# Patient Record
Sex: Female | Born: 2000 | Race: Black or African American | Hispanic: No | Marital: Single | Smoking: Former smoker
Health system: Southern US, Community
[De-identification: ages and names within clinical notes are randomized; demographics above are authoritative.]

## PROBLEM LIST (undated history)

## (undated) ENCOUNTER — Inpatient Hospital Stay (HOSPITAL_COMMUNITY): Payer: Self-pay

## (undated) DIAGNOSIS — J45909 Unspecified asthma, uncomplicated: Secondary | ICD-10-CM

## (undated) DIAGNOSIS — Z309 Encounter for contraceptive management, unspecified: Secondary | ICD-10-CM

## (undated) DIAGNOSIS — R300 Dysuria: Principal | ICD-10-CM

## (undated) DIAGNOSIS — O039 Complete or unspecified spontaneous abortion without complication: Secondary | ICD-10-CM

## (undated) DIAGNOSIS — N390 Urinary tract infection, site not specified: Secondary | ICD-10-CM

## (undated) DIAGNOSIS — T7840XA Allergy, unspecified, initial encounter: Secondary | ICD-10-CM

## (undated) HISTORY — DX: Dysuria: R30.0

## (undated) HISTORY — PX: NO PAST SURGERIES: SHX2092

## (undated) HISTORY — DX: Allergy, unspecified, initial encounter: T78.40XA

## (undated) HISTORY — DX: Complete or unspecified spontaneous abortion without complication: O03.9

## (undated) HISTORY — DX: Encounter for contraceptive management, unspecified: Z30.9

## (undated) HISTORY — DX: Unspecified asthma, uncomplicated: J45.909

## (undated) HISTORY — DX: Urinary tract infection, site not specified: N39.0

---
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
## 2018-01-18 ENCOUNTER — Other Ambulatory Visit: Payer: Medicaid Other

## 2018-01-20 ENCOUNTER — Other Ambulatory Visit: Payer: Medicaid Other

## 2018-02-01 ENCOUNTER — Ambulatory Visit (INDEPENDENT_AMBULATORY_CARE_PROVIDER_SITE_OTHER): Payer: Medicaid Other | Admitting: Women's Health

## 2018-02-01 ENCOUNTER — Encounter: Payer: Self-pay | Admitting: Women's Health

## 2018-02-01 ENCOUNTER — Ambulatory Visit (INDEPENDENT_AMBULATORY_CARE_PROVIDER_SITE_OTHER): Payer: Medicaid Other | Admitting: *Deleted

## 2018-02-01 ENCOUNTER — Other Ambulatory Visit: Payer: Self-pay

## 2018-02-01 VITALS — BP 110/56 | HR 84 | Wt 129.8 lb

## 2018-02-01 DIAGNOSIS — O26893 Other specified pregnancy related conditions, third trimester: Secondary | ICD-10-CM | POA: Diagnosis not present

## 2018-02-01 DIAGNOSIS — O98313 Other infections with a predominantly sexual mode of transmission complicating pregnancy, third trimester: Secondary | ICD-10-CM

## 2018-02-01 DIAGNOSIS — Z331 Pregnant state, incidental: Secondary | ICD-10-CM | POA: Diagnosis not present

## 2018-02-01 DIAGNOSIS — N898 Other specified noninflammatory disorders of vagina: Secondary | ICD-10-CM

## 2018-02-01 DIAGNOSIS — A599 Trichomoniasis, unspecified: Secondary | ICD-10-CM | POA: Diagnosis not present

## 2018-02-01 DIAGNOSIS — Z3A29 29 weeks gestation of pregnancy: Secondary | ICD-10-CM | POA: Diagnosis not present

## 2018-02-01 DIAGNOSIS — Z3490 Encounter for supervision of normal pregnancy, unspecified, unspecified trimester: Secondary | ICD-10-CM | POA: Insufficient documentation

## 2018-02-01 DIAGNOSIS — O0933 Supervision of pregnancy with insufficient antenatal care, third trimester: Secondary | ICD-10-CM

## 2018-02-01 DIAGNOSIS — Z1389 Encounter for screening for other disorder: Secondary | ICD-10-CM | POA: Diagnosis not present

## 2018-02-01 DIAGNOSIS — Z363 Encounter for antenatal screening for malformations: Secondary | ICD-10-CM

## 2018-02-01 DIAGNOSIS — Z3483 Encounter for supervision of other normal pregnancy, third trimester: Secondary | ICD-10-CM

## 2018-02-01 LAB — POCT URINALYSIS DIPSTICK
Blood, UA: NEGATIVE
GLUCOSE UA: NEGATIVE
Ketones, UA: NEGATIVE
Nitrite, UA: NEGATIVE

## 2018-02-01 LAB — POCT WET PREP (WET MOUNT): CLUE CELLS WET PREP WHIFF POC: POSITIVE

## 2018-02-01 MED ORDER — METRONIDAZOLE 500 MG PO TABS: 500.0000 mg | ORAL_TABLET | Freq: Two times a day (BID) | ORAL | 0 refills | Status: DC

## 2018-02-01 NOTE — Progress Notes (Signed)
INITIAL OBSTETRICAL VISIT Patient name: Autumn Ball MRN [id number]  Date of birth: April 30, 2001 Chief Complaint:   Initial Prenatal Visit  History of Present Illness:   Autumn Ball is a 17 y.o. 173P1011 African American female at 7573w6d by LMP c/w 15wk u/s, with an Estimated Date of Delivery: 04/13/18 being seen today for her initial obstetrical visit.   Her obstetrical history is significant for term uncomplicated SVB x 1, SAB x 1. Late prenatal care- [place] and has been trying to get appt there but unable to .   Today she reports vaginal d/c w/ odor x few weeks.  Patient's last menstrual period was 07/07/2017 (exact date). Last pap <21yo. Results were: n/a Review of Systems:   Pertinent items are noted in HPI Denies cramping/contractions, leakage of fluid, vaginal bleeding, abnormal vaginal discharge w/ itching/odor/irritation, headaches, visual changes, shortness of breath, chest pain, abdominal pain, severe nausea/vomiting, or problems with urination or bowel movements unless otherwise stated above.  Pertinent History Reviewed:  Reviewed past medical,surgical, social, obstetrical and family history.  Reviewed problem list, medications and allergies. OB History  Gravida Para Term Preterm AB Living  3 1 1  0 1 1  SAB TAB Ectopic Multiple Live Births  1 0 0 0 1    # Outcome Date GA Lbr Len/2nd Weight Sex Delivery Anes PTL Lv  3 Current           2 Term 09/29/16 4131w6d 11:30 / 00:59 6 lb 4.2 oz (2.84 kg) F Vag-Spont EPI  LIV  1 SAB 11/20/15             Physical Assessment:   Vitals:   02/01/18 1405  BP: (!) 110/56  Pulse: 84  Weight: 129 lb 12.8 oz (58.9 kg)  There is no height or weight on file to calculate BMI.       Physical Examination:  General appearance - well appearing, and in no distress  Mental status - alert, oriented to person, place, and time  Psych:  She has a normal mood and affect  Skin - warm and dry, normal color, no suspicious  lesions noted  Chest - effort normal, all lung fields clear to auscultation bilaterally  Heart - normal rate and regular rhythm  Abdomen - soft, nontender  Extremities:  No swelling or varicosities noted  Pelvic - VULVA: normal appearing vulva with no masses, tenderness or lesions  VAGINA: normal appearing vagina with normal color and lg amt thin yellow malodorous discharge, no lesions  CERVIX: normal appearing cervix without discharge or lesions, no CMT  Thin prep pap is not done  Fetal Heart Rate (bpm): 135 via doppler FH: 28cm  Results for orders placed or performed in visit on 02/01/18 (from the past 24 hour(s))  POCT urinalysis dipstick   Collection Time: 02/01/18  2:15 PM  Result Value Ref Range   Color, UA     Clarity, UA     Glucose, UA neg    Bilirubin, UA     Ketones, UA neg    Spec Grav, UA  1.010 - 1.025   Blood, UA neg    pH, UA  5.0 - 8.0   Protein, UA trace    Urobilinogen, UA  0.2 or 1.0 E.U./dL   Nitrite, UA neg    Leukocytes, UA Large (3+) (A) Negative   Appearance     Odor    POCT Wet Prep Mellody Drown([place]et [place])   Collection Time: 02/01/18  2:45  PM  Result Value Ref Range   Source Wet Prep POC vaginal    WBC, Wet Prep HPF POC many    Bacteria Wet Prep HPF POC Few Few   BACTERIA WET PREP MORPHOLOGY POC     Clue Cells Wet Prep HPF POC None None   Clue Cells Wet Prep Whiff POC Positive Whiff    Yeast Wet Prep HPF POC None    KOH Wet Prep POC     Trichomonas Wet Prep HPF POC Present (A) Absent    Assessment & Plan:  1) Low-Risk Pregnancy G3P1011 at [redacted]w[redacted]d with an Estimated Date of Delivery: 04/13/18   2) Initial OB visit  3) Late pnc> lives in [place] now, note sent to clinic to see if we can get her in there  4) Trichomonas> rx metronidazole 500mg  BID x 7d, pt states partner will get tx by his PCP. NO sex x at least 7d from both finishing meds. Plan poc for pt in 3-4wks.   Meds:  Meds ordered this encounter  Medications  . metroNIDAZOLE (FLAGYL) 500 MG  tablet    Sig: Take 1 tablet (500 mg total) by mouth 2 (two) times daily.    Dispense:  14 tablet    Refill:  0    Order Specific Question:   Supervising Provider    Answer:   Duane Lope H [[id number]]    Initial labs obtained Continue prenatal vitamins Reviewed n/v relief measures and warning s/s to report Reviewed recommended weight gain based on pre-gravid BMI Encouraged well-balanced diet Genetic Screening discussed Quad Screen: too late Cystic fibrosis screening discussed declined Ultrasound discussed; fetal survey: ordered CCNC completed>PCM not here  Follow-up: Return for asap for anatomy u/s and sugar test (no visit), then 2wks for LROB.   Orders Placed This Encounter  Procedures  . Urine Culture  . GC/Chlamydia Probe Amp  . US OB Comp + 14 Wk  . Pain Management Screening Profile (10S)  . CBC  . Rubella screen  . RPR  . Urinalysis, Routine w reflex microscopic  . Hepatitis B surface antigen  . HIV antibody  . POCT urinalysis dipstick  . POCT Wet Prep [employer])  . ABO/Rh  . Antibody screen    Cheral Marker CNM, [facility] 02/01/2018 2:49 PM

## 2018-02-01 NOTE — Patient Instructions (Addendum)
You will have your sugar test next visit.  Please do not eat or drink anything after midnight the night before you come, not even water.  You will be here for at least two hours.     Autumn Ball, I greatly value your feedback.  If you receive a survey following your visit with [place] today, we appreciate you taking the time to fill it out.  Thanks, Autumn Ball, CNM, WHNP-BC   Call the office [phone]) or go to [facility] if:  You begin to have strong, frequent contractions  Your water breaks.  Sometimes it is a big gush of fluid, sometimes it is just a trickle that keeps getting your panties wet or running down your legs  You have vaginal bleeding.  It is normal to have a small amount of spotting if your cervix was checked.   You don't feel your baby moving like normal.  If you don't, get you something to eat and drink and lay down and focus on feeling your baby move.  You should feel at least 10 movements in 2 hours.  If you don't, you should call the office or go to [facility].    Tdap Vaccine  It is recommended that you get the Tdap vaccine during the third trimester of EACH pregnancy to help protect your baby from getting pertussis (whooping cough)  27-36 weeks is the BEST time to do this so that you can pass the protection on to your baby. During pregnancy is better than after pregnancy, but if you are unable to get it during pregnancy it will be offered at the hospital.   You can get this vaccine at the health department or your family doctor  Everyone who will be around your baby should also be up-to-date on their vaccines. Adults (who are not pregnant) only need 1 dose of Tdap during adulthood.   Third Trimester of Pregnancy The third trimester is from week 29 through week 42, months 7 through 9. The third trimester is a time when the fetus is growing rapidly. At the end of the ninth month, the fetus is about 20 inches in length and weighs 6-10 pounds.  BODY  CHANGES Your body goes through many changes during pregnancy. The changes vary from woman to woman.   Your weight will continue to increase. You can expect to gain 25-35 pounds (11-16 kg) by the end of the pregnancy.  You may begin to get stretch marks on your hips, abdomen, and breasts.  You may urinate more often because the fetus is moving lower into your pelvis and pressing on your bladder.  You may develop or continue to have heartburn as a result of your pregnancy.  You may develop constipation because certain hormones are causing the muscles that push waste through your intestines to slow down.  You may develop hemorrhoids or swollen, bulging veins (varicose veins).  You may have pelvic pain because of the weight gain and pregnancy hormones relaxing your joints between the bones in your pelvis. Backaches may result from overexertion of the muscles supporting your posture.  You may have changes in your hair. These can include thickening of your hair, rapid growth, and changes in texture. Some women also have hair loss during or after pregnancy, or hair that feels dry or thin. Your hair will most likely return to normal after your baby is born.  Your breasts will continue to grow and be tender. A yellow discharge may leak from your breasts  called colostrum.  Your belly button may stick out.  You may feel short of breath because of your expanding uterus.  You may notice the fetus "dropping," or moving lower in your abdomen.  You may have a bloody mucus discharge. This usually occurs a few days to a week before labor begins.  Your cervix becomes thin and soft (effaced) near your due date. WHAT TO EXPECT AT YOUR PRENATAL EXAMS  You will have prenatal exams every 2 weeks until week 36. Then, you will have weekly prenatal exams. During a routine prenatal visit:  You will be weighed to make sure you and the fetus are growing normally.  Your blood pressure is taken.  Your abdomen  will be measured to track your baby's growth.  The fetal heartbeat will be listened to.  Any test results from the previous visit will be discussed.  You may have a cervical check near your due date to see if you have effaced. At around 36 weeks, your caregiver will check your cervix. At the same time, your caregiver will also perform a test on the secretions of the vaginal tissue. This test is to determine if a type of bacteria, Group B streptococcus, is present. Your caregiver will explain this further. Your caregiver may ask you:  What your birth plan is.  How you are feeling.  If you are feeling the baby move.  If you have had any abnormal symptoms, such as leaking fluid, bleeding, severe headaches, or abdominal cramping.  If you have any questions. Other tests or screenings that may be performed during your third trimester include:  Blood tests that check for low iron levels (anemia).  Fetal testing to check the health, activity level, and growth of the fetus. Testing is done if you have certain medical conditions or if there are problems during the pregnancy. FALSE LABOR You may feel small, irregular contractions that eventually go away. These are called Braxton Hicks contractions, or false labor. Contractions may last for hours, days, or even weeks before true labor sets in. If contractions come at regular intervals, intensify, or become painful, it is best to be seen by your caregiver.  SIGNS OF LABOR   Menstrual-like cramps.  Contractions that are 5 minutes apart or less.  Contractions that start on the top of the uterus and spread down to the lower abdomen and back.  A sense of increased pelvic pressure or back pain.  A watery or bloody mucus discharge that comes from the vagina. If you have any of these signs before the 37th week of pregnancy, call your caregiver right away. You need to go to the hospital to get checked immediately. HOME CARE INSTRUCTIONS   Avoid  all smoking, herbs, alcohol, and unprescribed drugs. These chemicals affect the formation and growth of the baby.  Follow your caregiver's instructions regarding medicine use. There are medicines that are either safe or unsafe to take during pregnancy.  Exercise only as directed by your caregiver. Experiencing uterine cramps is a good sign to stop exercising.  Continue to eat regular, healthy meals.  Wear a good support bra for breast tenderness.  Do not use hot tubs, steam rooms, or saunas.  Wear your seat belt at all times when driving.  Avoid raw meat, uncooked cheese, cat litter boxes, and soil used by cats. These carry germs that can cause birth defects in the baby.  Take your prenatal vitamins.  Try taking a stool softener (if your caregiver approves) if you develop  constipation. Eat more high-fiber foods, such as fresh vegetables or fruit and whole grains. Drink plenty of fluids to keep your urine clear or pale yellow.  Take warm sitz baths to soothe any pain or discomfort caused by hemorrhoids. Use hemorrhoid cream if your caregiver approves.  If you develop varicose veins, wear support hose. Elevate your feet for 15 minutes, 3-4 times a day. Limit salt in your diet.  Avoid heavy lifting, wear low heal shoes, and practice good posture.  Rest a lot with your legs elevated if you have leg cramps or low back pain.  Visit your dentist if you have not gone during your pregnancy. Use a soft toothbrush to brush your teeth and be gentle when you floss.  A sexual relationship may be continued unless your caregiver directs you otherwise.  Do not travel far distances unless it is absolutely necessary and only with the approval of your caregiver.  Take prenatal classes to understand, practice, and ask questions about the labor and delivery.  Make a trial run to the hospital.  Pack your hospital bag.  Prepare the baby's nursery.  Continue to go to all your prenatal visits as  directed by your caregiver. SEEK MEDICAL CARE IF:  You are unsure if you are in labor or if your water has broken.  You have dizziness.  You have mild pelvic cramps, pelvic pressure, or nagging pain in your abdominal area.  You have persistent nausea, vomiting, or diarrhea.  You have a bad smelling vaginal discharge.  You have pain with urination. SEEK IMMEDIATE MEDICAL CARE IF:   You have a fever.  You are leaking fluid from your vagina.  You have spotting or bleeding from your vagina.  You have severe abdominal cramping or pain.  You have rapid weight loss or gain.  You have shortness of breath with chest pain.  You notice sudden or extreme swelling of your face, hands, ankles, feet, or legs.  You have not felt your baby move in over an hour.  You have severe headaches that do not go away with medicine.  You have vision changes. Document Released: 10/26/2001 Document Revised: 11/06/2013 Document Reviewed: 01/02/2013 [facility] Patient Information 2015 [place], [place]. This information is not intended to replace advice given to you by your health care provider. Make sure you discuss any questions you have with your health care provider.   Trichomoniasis Trichomoniasis is an STI (sexually transmitted infection) that can affect both women and men. In women, the outer area of the female genitalia (vulva) and the vagina are affected. In men, the penis is mainly affected, but the prostate and other reproductive organs can also be involved. This condition can be treated with medicine. It often has no symptoms (is asymptomatic), especially in men. What are the causes? This condition is caused by an organism called Trichomonas vaginalis. Trichomoniasis most often spreads from person to person (is contagious) through sexual contact. What increases the risk? The following factors may make you more likely to develop this condition:  Having unprotected sexual intercourse.  Having  sexual intercourse with a partner who has trichomoniasis.  Having multiple sexual partners.  Having had previous trichomoniasis infections or other STIs.  What are the signs or symptoms? In women, symptoms of trichomoniasis include:  Abnormal vaginal discharge that is clear, white, gray, or yellow-green and foamy and has an unusual "fishy" odor.  Itching and irritation of the vagina and vulva.  Burning or pain during urination or sexual intercourse.  Genital redness  and swelling.  In men, symptoms of trichomoniasis include:  Penile discharge that may be foamy or contain pus.  Pain in the penis. This may happen only when urinating.  Itching or irritation inside the penis.  Burning after urination or ejaculation.  How is this diagnosed? In women, this condition may be found during a routine Pap test or physical exam. It may be found in men during a routine physical exam. Your health care provider may perform tests to help diagnose this infection, such as:  Urine tests (men and women).  The following in women: ? Testing the pH of the vagina. ? A vaginal swab test that checks for the Trichomonas vaginalis organism. ? Testing vaginal secretions.  Your health care provider may test you for other STIs, including HIV (human immunodeficiency virus). How is this treated? This condition is treated with medicine taken by mouth (orally), such as metronidazole or tinidazole to fight the infection. Your sexual partner(s) may also need to be tested and treated.  If you are a woman and you plan to become pregnant or think you may be pregnant, tell your health care provider right away. Some medicines that are used to treat the infection should not be taken during pregnancy.  Your health care provider may recommend over-the-counter medicines or creams to help relieve itching or irritation. You may be tested for infection again 3 months after treatment. Follow these instructions at  home:  Take and use over-the-counter and prescription medicines, including creams, only as told by your health care provider.  Do not have sexual intercourse until one week after you finish your medicine, or until your health care provider approves. Ask your health care provider when you may resume sexual intercourse.  (Women) Do not douche or wear tampons while you have the infection.  Discuss your infection with your sexual partner(s). Make sure that your partner gets tested and treated, if necessary.  Keep all follow-up visits as told by your health care provider. This is important. How is this prevented?  Use condoms every time you have sex. Using condoms correctly and consistently can help protect against STIs.  Avoid having multiple sexual partners.  Talk with your sexual partner about any symptoms that either of you may have, as well as any history of STIs.  Get tested for STIs and STDs (sexually transmitted diseases) before you have sex. Ask your partner to do the same.  Do not have sexual contact if you have symptoms of trichomoniasis or another STI. Contact a health care provider if:  You still have symptoms after you finish your medicine.  You develop pain in your abdomen.  You have pain when you urinate.  You have bleeding after sexual intercourse.  You develop a rash.  You feel nauseous or you vomit.  You plan to become pregnant or think you may be pregnant. Summary  Trichomoniasis is an STI (sexually transmitted infection) that can affect both women and men.  This condition often has no symptoms (is asymptomatic), especially in men.  You should not have sexual intercourse until one week after you finish your medicine, or until your health care provider approves. Ask your health care provider when you may resume sexual intercourse.  Discuss your infection with your sexual partner. Make sure that your partner gets tested and treated, if necessary. This  information is not intended to replace advice given to you by your health care provider. Make sure you discuss any questions you have with your health care provider.  Document Released: 04/27/2001 Document Revised: 09/24/2016 Document Reviewed: 09/24/2016 [employer]vier Interactive Patient Education  2017 [employer]

## 2018-02-02 LAB — CBC
HEMATOCRIT: 33.9 % — AB (ref 34.0–46.6)
Hemoglobin: 11.6 g/dL (ref 11.1–15.9)
MCH: 27.6 pg (ref 26.6–33.0)
MCHC: 34.2 g/dL (ref 31.5–35.7)
MCV: 81 fL (ref 79–97)
PLATELETS: 275 10*3/uL (ref 150–379)
RBC: 4.21 x10E6/uL (ref 3.77–5.28)
RDW: 15.9 % — AB (ref 12.3–15.4)
WBC: 10.4 10*3/uL (ref 3.4–10.8)

## 2018-02-02 LAB — RPR: RPR: NONREACTIVE

## 2018-02-02 LAB — URINALYSIS, ROUTINE W REFLEX MICROSCOPIC
Bilirubin, UA: NEGATIVE
Glucose, UA: NEGATIVE
Nitrite, UA: NEGATIVE
SPEC GRAV UA: 1.022 (ref 1.005–1.030)
Urobilinogen, Ur: 0.2 mg/dL (ref 0.2–1.0)
pH, UA: 6.5 (ref 5.0–7.5)

## 2018-02-02 LAB — ANTIBODY SCREEN: ANTIBODY SCREEN: NEGATIVE

## 2018-02-02 LAB — PMP SCREEN PROFILE (10S), URINE
AMPHETAMINE SCREEN URINE: NEGATIVE ng/mL
BARBITURATE SCREEN URINE: NEGATIVE ng/mL
BENZODIAZEPINE SCREEN, URINE: NEGATIVE ng/mL
CANNABINOIDS UR QL SCN: POSITIVE ng/mL — AB
CREATININE(CRT), U: 185.6 mg/dL (ref 20.0–300.0)
Cocaine (Metab) Scrn, Ur: NEGATIVE ng/mL
METHADONE SCREEN, URINE: NEGATIVE ng/mL
OXYCODONE+OXYMORPHONE UR QL SCN: NEGATIVE ng/mL
Opiate Scrn, Ur: NEGATIVE ng/mL
PHENCYCLIDINE QUANTITATIVE URINE: NEGATIVE ng/mL
PROPOXYPHENE SCREEN URINE: NEGATIVE ng/mL
Ph of Urine: 6.4 (ref 4.5–8.9)

## 2018-02-02 LAB — MICROSCOPIC EXAMINATION
CASTS: NONE SEEN /LPF
WBC, UA: >30 /hpf — AB (ref 0–5)

## 2018-02-02 LAB — ABO/RH: Rh Factor: POSITIVE

## 2018-02-02 LAB — RUBELLA SCREEN: Rubella Antibodies, IGG: 8.67 index (ref >0.99)

## 2018-02-02 LAB — HIV ANTIBODY (ROUTINE TESTING W REFLEX): HIV Screen 4th Generation wRfx: NONREACTIVE

## 2018-02-02 LAB — HEPATITIS B SURFACE ANTIGEN: Hepatitis B Surface Ag: NEGATIVE

## 2018-02-02 LAB — MED LIST OPTION NOT SELECTED

## 2018-02-03 ENCOUNTER — Encounter: Payer: Self-pay | Admitting: Women's Health

## 2018-02-03 LAB — URINE CULTURE

## 2018-02-03 LAB — GC/CHLAMYDIA PROBE AMP
Chlamydia trachomatis, NAA: NEGATIVE
Neisseria gonorrhoeae by PCR: NEGATIVE

## 2018-02-08 ENCOUNTER — Other Ambulatory Visit: Payer: Medicaid Other

## 2018-02-15 ENCOUNTER — Encounter: Payer: Medicaid Other | Admitting: Advanced Practice Midwife

## 2018-02-15 ENCOUNTER — Encounter: Payer: Self-pay | Admitting: Advanced Practice Midwife

## 2018-03-01 ENCOUNTER — Encounter: Payer: Self-pay | Admitting: *Deleted

## 2018-03-01 ENCOUNTER — Other Ambulatory Visit: Payer: Medicaid Other

## 2018-03-01 ENCOUNTER — Encounter: Payer: Medicaid Other | Admitting: Obstetrics and Gynecology

## 2018-04-01 ENCOUNTER — Other Ambulatory Visit: Payer: Self-pay

## 2018-04-01 ENCOUNTER — Inpatient Hospital Stay (HOSPITAL_COMMUNITY): Payer: Medicaid Other | Admitting: Anesthesiology

## 2018-04-01 ENCOUNTER — Inpatient Hospital Stay (HOSPITAL_COMMUNITY)
Admission: AD | Admit: 2018-04-01 | Discharge: 2018-04-03 | DRG: 806 | Disposition: A | Discharge status: Home / Self Care | Payer: Medicaid Other | Source: Ambulatory Visit | Attending: Obstetrics and Gynecology | Admitting: Obstetrics and Gynecology

## 2018-04-01 ENCOUNTER — Encounter (HOSPITAL_COMMUNITY): Payer: Self-pay | Admitting: *Deleted

## 2018-04-01 DIAGNOSIS — O99324 Drug use complicating childbirth: Secondary | ICD-10-CM | POA: Diagnosis present

## 2018-04-01 DIAGNOSIS — O9832 Other infections with a predominantly sexual mode of transmission complicating childbirth: Secondary | ICD-10-CM | POA: Diagnosis present

## 2018-04-01 DIAGNOSIS — O0933 Supervision of pregnancy with insufficient antenatal care, third trimester: Secondary | ICD-10-CM

## 2018-04-01 DIAGNOSIS — F1290 Cannabis use, unspecified, uncomplicated: Secondary | ICD-10-CM | POA: Diagnosis present

## 2018-04-01 DIAGNOSIS — Z3A38 38 weeks gestation of pregnancy: Secondary | ICD-10-CM

## 2018-04-01 DIAGNOSIS — O4290 Premature rupture of membranes, unspecified as to length of time between rupture and onset of labor, unspecified weeks of gestation: Secondary | ICD-10-CM | POA: Diagnosis present

## 2018-04-01 DIAGNOSIS — A5901 Trichomonal vulvovaginitis: Secondary | ICD-10-CM | POA: Diagnosis present

## 2018-04-01 DIAGNOSIS — Z87891 Personal history of nicotine dependence: Secondary | ICD-10-CM | POA: Diagnosis not present

## 2018-04-01 DIAGNOSIS — O4292 Full-term premature rupture of membranes, unspecified as to length of time between rupture and onset of labor: Principal | ICD-10-CM | POA: Diagnosis present

## 2018-04-01 LAB — CBC
HCT: 37.7 % (ref 36.0–46.0)
HEMOGLOBIN: 12.3 g/dL (ref 12.0–15.0)
MCH: 26.9 pg (ref 26.0–34.0)
MCHC: 32.6 g/dL (ref 30.0–36.0)
MCV: 82.5 fL (ref 78.0–100.0)
PLATELETS: 262 10*3/uL (ref 150–400)
RBC: 4.57 MIL/uL (ref 3.87–5.11)
RDW: 15.9 % — ABNORMAL HIGH (ref 11.5–15.5)
WBC: 8.7 10*3/uL (ref 4.0–10.5)

## 2018-04-01 LAB — POCT FERN TEST: POCT FERN TEST: POSITIVE

## 2018-04-01 LAB — OB RESULTS CONSOLE GBS: GBS: NEGATIVE

## 2018-04-01 LAB — RAPID URINE DRUG SCREEN, HOSP PERFORMED
AMPHETAMINES: NOT DETECTED
BARBITURATES: NOT DETECTED
Benzodiazepines: NOT DETECTED
COCAINE: NOT DETECTED
Opiates: NOT DETECTED
TETRAHYDROCANNABINOL: NOT DETECTED

## 2018-04-01 LAB — TYPE AND SCREEN
ABO/RH(D): O POS
ANTIBODY SCREEN: NEGATIVE

## 2018-04-01 LAB — GROUP B STREP BY PCR: Group B strep by PCR: NEGATIVE

## 2018-04-01 LAB — HEMOGLOBIN A1C
HEMOGLOBIN A1C: 4.9 % (ref 4.8–5.6)
MEAN PLASMA GLUCOSE: 93.93 mg/dL

## 2018-04-01 LAB — GLUCOSE, CAPILLARY: Glucose-Capillary: 68 mg/dL (ref 65–99)

## 2018-04-01 MED ORDER — ACETAMINOPHEN 325 MG PO TABS: 650.0000 mg | ORAL_TABLET | ORAL | Status: DC | PRN

## 2018-04-01 MED ORDER — PRENATAL MULTIVITAMIN CH
1.0000 | ORAL_TABLET | Freq: Every day | ORAL | Status: DC
  Administered 2018-04-02 – 2018-04-03 (×2): 1 via ORAL
  Filled 2018-04-01 (×2): qty 1

## 2018-04-01 MED ORDER — ONDANSETRON HCL 4 MG/2ML IJ SOLN: 4.0000 mg | INTRAMUSCULAR | Status: DC | PRN

## 2018-04-01 MED ORDER — ONDANSETRON HCL 4 MG/2ML IJ SOLN: 4.0000 mg | Freq: Four times a day (QID) | INTRAMUSCULAR | Status: DC | PRN

## 2018-04-01 MED ORDER — METRONIDAZOLE 500 MG PO TABS
2000.0000 mg | ORAL_TABLET | Freq: Once | ORAL | Status: AC
  Administered 2018-04-01: 2000 mg via ORAL
  Filled 2018-04-01 (×2): qty 4

## 2018-04-01 MED ORDER — SENNOSIDES-DOCUSATE SODIUM 8.6-50 MG PO TABS
2.0000 | ORAL_TABLET | ORAL | Status: DC
  Administered 2018-04-03: 2 via ORAL
  Filled 2018-04-01: qty 2

## 2018-04-01 MED ORDER — EPHEDRINE 5 MG/ML INJ
10.0000 mg | INTRAVENOUS | Status: DC | PRN
  Filled 2018-04-01: qty 2

## 2018-04-01 MED ORDER — LACTATED RINGERS IV SOLN
INTRAVENOUS | Status: DC
  Administered 2018-04-01 (×2): via INTRAVENOUS

## 2018-04-01 MED ORDER — SOD CITRATE-CITRIC ACID 500-334 MG/5ML PO SOLN: 30.0000 mL | ORAL | Status: DC | PRN

## 2018-04-01 MED ORDER — TERBUTALINE SULFATE 1 MG/ML IJ SOLN
0.2500 mg | Freq: Once | INTRAMUSCULAR | Status: DC | PRN
  Filled 2018-04-01: qty 1

## 2018-04-01 MED ORDER — OXYCODONE-ACETAMINOPHEN 5-325 MG PO TABS: 2.0000 | ORAL_TABLET | ORAL | Status: DC | PRN

## 2018-04-01 MED ORDER — COCONUT OIL OIL: 1.0000 "application " | TOPICAL_OIL | Status: DC | PRN

## 2018-04-01 MED ORDER — FENTANYL 2.5 MCG/ML BUPIVACAINE 1/10 % EPIDURAL INFUSION (WH - ANES)
14.0000 mL/h | INTRAMUSCULAR | Status: DC | PRN
  Administered 2018-04-01: 14 mL/h via EPIDURAL
  Filled 2018-04-01: qty 100

## 2018-04-01 MED ORDER — FLEET ENEMA 7-19 GM/118ML RE ENEM: 1.0000 | ENEMA | RECTAL | Status: DC | PRN

## 2018-04-01 MED ORDER — DIPHENHYDRAMINE HCL 25 MG PO CAPS: 25.0000 mg | ORAL_CAPSULE | Freq: Four times a day (QID) | ORAL | Status: DC | PRN

## 2018-04-01 MED ORDER — PHENYLEPHRINE 40 MCG/ML (10ML) SYRINGE FOR IV PUSH (FOR BLOOD PRESSURE SUPPORT)
80.0000 ug | PREFILLED_SYRINGE | INTRAVENOUS | Status: DC | PRN
  Filled 2018-04-01: qty 10
  Filled 2018-04-01: qty 5

## 2018-04-01 MED ORDER — BENZOCAINE-MENTHOL 20-0.5 % EX AERO
1.0000 "application " | INHALATION_SPRAY | CUTANEOUS | Status: DC | PRN
  Administered 2018-04-02: 1 via TOPICAL
  Filled 2018-04-01: qty 56

## 2018-04-01 MED ORDER — ONDANSETRON HCL 4 MG PO TABS: 4.0000 mg | ORAL_TABLET | ORAL | Status: DC | PRN

## 2018-04-01 MED ORDER — DIBUCAINE 1 % RE OINT: 1.0000 "application " | TOPICAL_OINTMENT | RECTAL | Status: DC | PRN

## 2018-04-01 MED ORDER — LIDOCAINE HCL (PF) 1 % IJ SOLN
INTRAMUSCULAR | Status: DC | PRN
  Administered 2018-04-01: 3 mL via EPIDURAL
  Administered 2018-04-01: 5 mL via EPIDURAL

## 2018-04-01 MED ORDER — OXYTOCIN 40 UNITS IN LACTATED RINGERS INFUSION - SIMPLE MED
1.0000 m[IU]/min | INTRAVENOUS | Status: DC
  Administered 2018-04-01: 2 m[IU]/min via INTRAVENOUS

## 2018-04-01 MED ORDER — OXYTOCIN BOLUS FROM INFUSION
500.0000 mL | Freq: Once | INTRAVENOUS | Status: AC
  Administered 2018-04-01: 500 mL via INTRAVENOUS

## 2018-04-01 MED ORDER — WITCH HAZEL-GLYCERIN EX PADS: 1.0000 "application " | MEDICATED_PAD | CUTANEOUS | Status: DC | PRN

## 2018-04-01 MED ORDER — LIDOCAINE HCL (PF) 1 % IJ SOLN
30.0000 mL | INTRAMUSCULAR | Status: DC | PRN
  Filled 2018-04-01: qty 30

## 2018-04-01 MED ORDER — DIPHENHYDRAMINE HCL 50 MG/ML IJ SOLN: 12.5000 mg | INTRAMUSCULAR | Status: DC | PRN

## 2018-04-01 MED ORDER — LACTATED RINGERS IV SOLN: 500.0000 mL | INTRAVENOUS | Status: DC | PRN

## 2018-04-01 MED ORDER — OXYCODONE-ACETAMINOPHEN 5-325 MG PO TABS: 1.0000 | ORAL_TABLET | ORAL | Status: DC | PRN

## 2018-04-01 MED ORDER — TETANUS-DIPHTH-ACELL PERTUSSIS 5-2.5-18.5 LF-MCG/0.5 IM SUSP
0.5000 mL | Freq: Once | INTRAMUSCULAR | Status: AC
  Administered 2018-04-03: 0.5 mL via INTRAMUSCULAR
  Filled 2018-04-01: qty 0.5

## 2018-04-01 MED ORDER — PHENYLEPHRINE 40 MCG/ML (10ML) SYRINGE FOR IV PUSH (FOR BLOOD PRESSURE SUPPORT)
80.0000 ug | PREFILLED_SYRINGE | INTRAVENOUS | Status: DC | PRN
  Filled 2018-04-01: qty 5

## 2018-04-01 MED ORDER — ZOLPIDEM TARTRATE 5 MG PO TABS: 5.0000 mg | ORAL_TABLET | Freq: Every evening | ORAL | Status: DC | PRN

## 2018-04-01 MED ORDER — IBUPROFEN 600 MG PO TABS
600.0000 mg | ORAL_TABLET | Freq: Four times a day (QID) | ORAL | Status: DC
  Administered 2018-04-01 – 2018-04-03 (×7): 600 mg via ORAL
  Filled 2018-04-01 (×7): qty 1

## 2018-04-01 MED ORDER — OXYTOCIN 40 UNITS IN LACTATED RINGERS INFUSION - SIMPLE MED
2.5000 [IU]/h | INTRAVENOUS | Status: DC
  Administered 2018-04-01: 2.5 [IU]/h via INTRAVENOUS
  Filled 2018-04-01: qty 1000

## 2018-04-01 MED ORDER — LACTATED RINGERS IV SOLN
500.0000 mL | Freq: Once | INTRAVENOUS | Status: AC
  Administered 2018-04-01: 500 mL via INTRAVENOUS

## 2018-04-01 MED ORDER — SIMETHICONE 80 MG PO CHEW: 80.0000 mg | CHEWABLE_TABLET | ORAL | Status: DC | PRN

## 2018-04-01 NOTE — Anesthesia Pain Management Evaluation Note (Signed)
  CRNA Pain Management Visit Note  Patient: Autumn Ball, 17 y.o., female  "Hello I am a member of the anesthesia team at [facility]. We have an anesthesia team available at all times to provide care throughout the hospital, including epidural management and anesthesia for C-section. I don't know your plan for the delivery whether it a natural birth, water birth, IV sedation, nitrous supplementation, doula or epidural, but we want to meet your pain goals."   1.Was your pain managed to your expectations on prior hospitalizations?   Yes   2.What is your expectation for pain management during this hospitalization?     Epidural  3.How can we help you reach that goal? unsure  Record the patient's initial score and the patient's pain goal.   Pain: 0  Pain Goal: 8 [facility] wants you to be able to say your pain was always managed very well.  Cephus Shelling 04/01/2018

## 2018-04-01 NOTE — H&P (Signed)
OBSTETRIC ADMISSION HISTORY AND PHYSICAL  Autumn Ball is a 17 y.o. female and  G3P1011 with IUP at [redacted]w[redacted]d by 15wk ultrasound presenting for SROM at 12:02PM with +fern test. She reports +FMs, No LOF, no VB, no blurry vision, headaches or peripheral edema, and RUQ pain.  She plans on bottle feeding. She request depo provera for birth control.    She received her prenatal care at [facility]   Patient was late to prenatal care at 29 weeks.  She had a single prenatal care visit at 29wk6d. At the visit lab results showed +THC and +trichomonas treated with metronidazole.  She states her partner tested negative and took some of the antibiotic.  She feels her contractions and fetal movement.  Dating: By [place] --->  Estimated Date of Delivery: 04/13/18  Limited Sono:    , CWD, normal anatomy, posterior low lying placenta,  Cephalic, presentation, 148g   Prenatal History/Complications:  Past Medical History: Past Medical History:  Diagnosis Date  . Allergy   . Asthma   . Burning with urination 01/19/2016  . Contraceptive management 06/11/2015  . Miscarriage   . UTI (lower urinary tract infection) 01/19/2016    Past Surgical History: Past Surgical History:  Procedure Laterality Date  . NO PAST SURGERIES      Obstetrical History: OB History    Gravida  3   Para  1   Term  1   Preterm  0   AB  1   Living  1     SAB  1   TAB  0   Ectopic  0   Multiple  0   Live Births  1           Social History: Social History   Socioeconomic History  . Marital status: Single    Spouse name: Not on file  . Number of children: Not on file  . Years of education: Not on file  . Highest education level: Not on file  Occupational History  . Not on file  Social Needs  . Financial resource strain: Not on file  . Food insecurity:    Worry: Not on file    Inability: Not on file  . Transportation needs:    Medical: Not on file    Non-medical: Not on file  Tobacco Use  .  Smoking status: Former Smoker    Types: Cigarettes    Last attempt to quit: 11/13/2015    Years since quitting: 2.3  . Smokeless tobacco: Former Engineer, water and Sexual Activity  . Alcohol use: No    Alcohol/week: 0.0 oz  . Drug use: Yes    Types: Marijuana    Comment: last used in feb 2019  . Sexual activity: Not Currently    Birth control/protection: None  Lifestyle  . Physical activity:    Days per week: Not on file    Minutes per session: Not on file  . Stress: Not on file  Relationships  . Social connections:    Talks on phone: Not on file    Gets together: Not on file    Attends religious service: Not on file    Active member of club or organization: Not on file    Attends meetings of clubs or organizations: Not on file    Relationship status: Not on file  Other Topics Concern  . Not on file  Social History Narrative   Lives with mother and younger sister    Family History:  Family History  Problem Relation Age of Onset  . Depression Mother   . Hearing loss Mother   . Hearing loss Sister   . Vision loss Sister   . Hearing loss Maternal Grandmother   . Heart disease Maternal Grandmother   . Hyperlipidemia Maternal Grandmother   . Hypertension Maternal Grandmother   . Vision loss Maternal Grandmother   . Arthritis Maternal Grandfather     Allergies: No Known Allergies  Medications Prior to Admission  Medication Sig Dispense Refill Last Dose  . metroNIDAZOLE (FLAGYL) 500 MG tablet Take 1 tablet (500 mg total) by mouth 2 (two) times daily. 14 tablet 0   . prenatal vitamin w/FE, FA (PRENATAL 1 + 1) 27-1 MG TABS tablet Take 1 tablet daily at 12 noon by mouth. 30 each 12 Taking     Review of Systems   All systems reviewed and negative except as stated in HPI  Blood pressure 128/78, pulse 90, temperature 98.5 F (36.9 C), temperature source Oral, resp. rate 18, height  (1.6 m), weight 61.8 kg (136 lb 3 oz), last menstrual period 07/07/2017, SpO2 100  %, not currently breastfeeding. General appearance: alert and cooperative Lungs: clear to auscultation bilaterally Heart: regular rate and rhythm Abdomen: soft, non-tender; bowel sounds normal Extremities: Homans sign is negative, no sign of DVT DTR's present Presentation: deferred Fetal monitoringBaseline: 130 bpm, Variability: Good {> 6 bpm), Accelerations: Reactive and Decelerations: Absent Uterine activity: Irregular Dilation: 4 Effacement (%): 70 Station: -1 Exam by:: Doreatha Massed, RNC   Prenatal labs: ABO, Rh: O/Positive/-- (03/20 1458) Antibody: Negative (03/20 1458) Rubella: 8.67 (03/20 1458) RPR: Non Reactive (03/20 1458)  HBsAg: Negative (03/20 1458)  HIV: Non Reactive (03/20 1458)  GBS:    1 hr Glucola: Limited prenatal care did not receive Genetic screening: Limited prenatal care did not receive Anatomy US: Nml (Limited ultrasound at 15wk6d)  Prenatal Transfer Tool  Maternal Diabetes: Not tested Genetic Screening: Did not receive Maternal Ultrasounds/Referrals: Normal (limited ultrasound due to fetal age) Fetal Ultrasounds or other Referrals:  None Maternal Substance Abuse:  Yes:  Type: Marijuana Significant Maternal Medications:  Meds include: Other: Metronidazole for Trichomonas  Significant Maternal Lab Results: Lab values include: Other:  +Trichomonas at 29 wks +THC + Results for orders placed or performed during the hospital encounter of 04/01/18 (from the past 24 hour(s))  POCT fern test   Collection Time: 04/01/18  1:25 PM  Result Value Ref Range   POCT Fern Test Positive = ruptured amniotic membanes     Patient Active Problem List   Diagnosis Date Noted  . Supervision of normal pregnancy 02/01/2018  . Late prenatal care affecting pregnancy in third trimester 02/01/2018  . Trichomonas infection 02/01/2018  . Marijuana use 03/08/2016    Assessment/Plan:  [name] is a 17 y.o. G3P1011 at [redacted]w[redacted]d here for SROM at 12:02PM.  Patient was late to  prenatal care at 29 weeks with single prenatal office visit and +THC, +trichomonas that may not have been adequately treated.    #Labor: Progressing normally.  Start pitocin, 2x2. #Pain: Analgesics as needed #FWB: Cat I #ID:  GBS unknown: Penicillin             Trichomonas: Metronidazole  #MOF: Bottle Feeding #MOC: Depo Provera #Circ:  No  Alfonso Ellis, Medical Student  04/01/2018, 1:43 PM

## 2018-04-01 NOTE — Anesthesia Preprocedure Evaluation (Signed)
Anesthesia Evaluation  Patient identified by MRN, date of birth, ID band Patient awake    Reviewed: Allergy & Precautions, H&P , NPO status , Patient's Chart, lab work & pertinent test results  Airway Mallampati: II   Neck ROM: full    Dental   Pulmonary asthma , former smoker,    breath sounds clear to auscultation       Cardiovascular negative cardio ROS   Rhythm:regular Rate:Normal     Neuro/Psych    GI/Hepatic   Endo/Other    Renal/GU      Musculoskeletal   Abdominal   Peds  Hematology   Anesthesia Other Findings   Reproductive/Obstetrics                             Anesthesia Physical Anesthesia Plan  ASA: II  Anesthesia Plan: Epidural   Post-op Pain Management:    Induction: Intravenous  PONV Risk Score and Plan: 2 and Treatment may vary due to age or medical condition  Airway Management Planned: Natural Airway  Additional Equipment:   Intra-op Plan:   Post-operative Plan:   Informed Consent: I have reviewed the patients History and Physical, chart, labs and discussed the procedure including the risks, benefits and alternatives for the proposed anesthesia with the patient or authorized representative who has indicated his/her understanding and acceptance.     Plan Discussed with: Anesthesiologist  Anesthesia Plan Comments:         Anesthesia Quick Evaluation

## 2018-04-01 NOTE — Progress Notes (Addendum)
Labor Progress Note SEVERINA SYKORA is a 17 y.o. G3P1011 at [redacted]w[redacted]d presented with SROM at 12:02PM.   S: Patient pain level 6-7.  Able to feel regular contractions and fetal movement.  O:  BP 117/63   Pulse 77   Temp 98.4 F (36.9 C) (Oral)   Resp 20   Ht  (1.6 m)   Wt 61.8 kg (136 lb 3 oz)   LMP 07/07/2017 (Exact Date)   SpO2 100%   BMI 24.12 kg/m  EFM: 130bpm, moderate variability, reactive accelerations, absent decelerations  CVE: Dilation: 4.5 Effacement (%): 80 Station: -2, -1 Presentation: Vertex Exam by:: Camelia Eng, RN   A&P: 17 y.o. [id number] [redacted]w[redacted]d here for SROM at 12:02PM.  Patient was late to prenatal care at 29 weeks with single prenatal office visit and +THC, +trichomonas that may not have been adequately treated.    #Labor: Progressing normally. Continue pitocin. #Pain: Analgesics as needed.   #FWB: Cat I #GBS negative  #Trichomonas: Metronidazole    Alfonso Ellis, Medical Student 6:42 PM

## 2018-04-01 NOTE — Anesthesia Procedure Notes (Signed)
Epidural Patient location during procedure: OB Start time: 04/01/2018 7:39 PM End time: 04/01/2018 7:49 PM  Staffing Anesthesiologist: Achille Rich, MD Performed: anesthesiologist   Preanesthetic Checklist Completed: patient identified, site marked, pre-op evaluation, timeout performed, IV checked, risks and benefits discussed and monitors and equipment checked  Epidural Patient position: sitting Prep: DuraPrep Patient monitoring: heart rate, cardiac monitor, continuous pulse ox and blood pressure Approach: midline Location: L2-L3 Injection technique: LOR saline  Needle:  Needle type: Tuohy  Needle gauge: 17 G Needle length: 9 cm Needle insertion depth: 4 cm Catheter type: closed end flexible Catheter size: 19 Gauge Catheter at skin depth: 10 cm Test dose: negative and Other  Assessment Events: blood not aspirated, injection not painful, no injection resistance and negative IV test  Additional Notes Informed consent obtained prior to proceeding including risk of failure, 1% risk of PDPH, risk of minor discomfort and bruising.  Discussed rare but serious complications including epidural abscess, permanent nerve injury, epidural hematoma.  Discussed alternatives to epidural analgesia and patient desires to proceed.  Timeout performed pre-procedure verifying patient name, procedure, and platelet count.  Patient tolerated procedure well. Reason for block:procedure for pain

## 2018-04-01 NOTE — Progress Notes (Signed)
LABOR PROGRESS NOTE  Autumn Ball is a 17 y.o. G3P1011 at [redacted]w[redacted]d  admitted for SOL  Subjective: Doing well, comfortable with epidural. No complaints  Objective: BP 129/60   Pulse 92   Temp 98.1 F (36.7 C) (Oral)   Resp 18   Ht  (1.6 m)   Wt 61.8 kg (136 lb 3 oz)   LMP 07/07/2017 (Exact Date)   SpO2 100%   BMI 24.12 kg/m  or  Vitals:   04/01/18 2015 04/01/18 2016 04/01/18 2020 04/01/18 2030  BP: 126/67 126/67 (!) 103/55 129/60  Pulse: 82 82 (!) 103 92  Resp: Temp:      TempSrc:      SpO2: 100% 100%    Weight:      Height:         Dilation: 6 Effacement (%): 80 Cervical Position: Middle Station: 0, -1 Presentation: Vertex Exam by:: Gwendolyn Grant, RN  FHT: 130 bpm, moderate variability, no accelerations, no decelerations Uterine activity: q74min  Labs: Lab Results  Component Value Date   WBC 8.7 04/01/2018   HGB 12.3 04/01/2018   HCT 37.7 04/01/2018   MCV 82.5 04/01/2018   PLT 262 04/01/2018    Patient Active Problem List   Diagnosis Date Noted  . Leakage of amniotic fluid 04/01/2018  . Supervision of normal pregnancy 02/01/2018  . Late prenatal care affecting pregnancy in third trimester 02/01/2018  . Trichomonas infection 02/01/2018  . Marijuana use 03/08/2016    Assessment / Plan: 17 y.o. G3P1011 at [redacted]w[redacted]d here for SOL  Labor: pitocin Fetal Wellbeing:  Category I Pain Control:  epidural Anticipated MOD:  SVD  Leland Her, DO PGY-2 5/18/20198:54 PM

## 2018-04-01 NOTE — MAU Note (Signed)
Pt presents with c/o LOF @ 1202 this afternoon.  Reports fluid is clear.  Denies VB, states having ctxs.  Reports +FM.

## 2018-04-02 LAB — RPR: RPR Ser Ql: NONREACTIVE

## 2018-04-02 LAB — HIV ANTIBODY (ROUTINE TESTING W REFLEX): HIV Screen 4th Generation wRfx: NONREACTIVE

## 2018-04-02 NOTE — Clinical Social Work Maternal (Signed)
CLINICAL SOCIAL WORK MATERNAL/CHILD NOTE  Patient Details  Name: Autumn Ball MRN: [id number] Date of Birth: 04/01/2020  Date:  04/02/2018  Clinical Social Worker Initiating Note:  Madilyn Fireman, MSW, LCSW-A Date/Time: Initiated:  04/02/18/1057     Child's Name:  Autumn Ball   Biological Parents:  Mother, Father   Need for Interpreter:  None   Reason for Referral:  Late or No Prenatal Care    Address:  [place] [address]    Phone number:  [phone] (home)     Additional phone number:   Household Members/Support Persons (HM/SP):       HM/SP Name Relationship DOB or Age  HM/SP -1        HM/SP -2        HM/SP -3        HM/SP -4        HM/SP -5        HM/SP -6        HM/SP -7        HM/SP -8          Natural Supports (not living in the home):  Extended Family, Friends, Immediate Family, Spouse/significant other   Professional Supports: None   Employment: Unemployed   Type of Work:     Education:  9 to 11 years(Patient is graduating from [employer] in June 2019)   Homebound arranged: Yes(Patient is finishing her last semester of high school online)  Museum/gallery curator Resources:  Medicaid   Other Resources:  Location manager educated patient on how to obtain [facility])   Cultural/Religious Considerations Which May Impact Care:  None  Strengths:  Ability to meet basic needs , Engineer, materials, Home prepared for child    Psychotropic Medications:         Pediatrician:    Solicitor area  Pediatrician List:   [facility] for [place]      Pediatrician Fax Number:    Risk Factors/Current Problems:      Cognitive State:  Alert , Able to Concentrate    Mood/Affect:  Comfortable , Calm    CSW Assessment: CSW met with patient and infant, Makonnen at bedside to discuss consult reasoning for late prenatal care. Per  patient, she moved to [place] at ~29 weeks and had difficulty getting an OB appointment. 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 Patient lives alone with her daughter at an apartment in [place]. Patient's daughter is 33 months old, named Autumn Ball who is currently with her father while patient is at [facility]. Patient reports that the infant will go to [facility]. Patient has a brand new car seat for infant and knows how to use and install it. Patient has a bassinet and pack and play for infant for safe sleeping, SIDS reduction methods discussed. Patient is not employed and does not receive [place] or Food Stamps. CSW and patient discussed getting [place] for daughter and son for nutrition assistance. Patient states that the father of this child is currently incarcerated in prison and will be released in August, patient did not provide CSW with his name. Patient states she has a great support system. CSW educated patient on hospital drug screening policy for urine and cord due to [facility] Patient UDS negative upon admission. Patient  stated understanding and agreement and that her last use of THC was in February 2019. Patient not concerned about results and had no questions. CSW will continue to monitor for urine and cord results for infant, will make report if necessary.    CSW Plan/Description:  Sudden Infant Death Syndrome (SIDS) Education, Perinatal Mood and Anxiety Disorder (PMADs) Education, CSW Will Continue to Monitor Umbilical Cord Tissue Drug Screen Results and Make Report if [facility] [place], No Further Intervention Required/No Barriers to Discharge    Archie Endo, [place] 04/02/2018, 10:59 AM

## 2018-04-02 NOTE — Anesthesia Postprocedure Evaluation (Signed)
Anesthesia Post Note  Patient: Azalia L Botkin  Procedure(s) Performed: AN AD HOC LABOR EPIDURAL     Patient location during evaluation: Mother Baby Anesthesia Type: Epidural Level of consciousness: awake and alert and oriented Pain management: satisfactory to patient Vital Signs Assessment: post-procedure vital signs reviewed and stable Respiratory status: respiratory function stable Cardiovascular status: stable Postop Assessment: no headache, no backache, epidural receding, patient able to bend at knees, no signs of nausea or vomiting and adequate PO intake Anesthetic complications: no    Last Vitals:  Vitals:   04/02/18 0031 04/02/18 0500  BP: 109/65 (!) 112/50  Pulse: 84 60  Resp: 18 16  Temp: 36.8 C 36.9 C  SpO2: 96% 100%    Last Pain:  Vitals:   04/02/18 0601  TempSrc:   PainSc: 0-No pain   Pain Goal:                 Crislyn Willbanks

## 2018-04-02 NOTE — Progress Notes (Addendum)
Post Partum Day 1 Subjective: no complaints  Objective: Blood pressure (!) 112/50, pulse 60, temperature 98.4 F (36.9 C), temperature source Axillary, resp. rate 16, height  (1.6 m), weight 136 lb 3 oz (61.8 kg), last menstrual period 07/07/2017, SpO2 100 %, unknown if currently breastfeeding.  Physical Exam:  General: alert and no distress Lochia: appropriate Uterine Fundus: firm DVT Evaluation: No evidence of DVT seen on physical exam.  Recent Labs    04/01/18 1349  HGB 12.3  HCT 37.7    Assessment/Plan: Plan for discharge tomorrow   LOS: 1 day   Garnette Gunner 04/02/2018, 7:50 AM  I assessed this pt and agree with the above assessment

## 2018-04-03 ENCOUNTER — Telehealth: Payer: Self-pay | Admitting: *Deleted

## 2018-04-03 MED ORDER — IBUPROFEN 600 MG PO TABS: 600.0000 mg | ORAL_TABLET | Freq: Four times a day (QID) | ORAL | 0 refills | Status: DC

## 2018-04-03 NOTE — Telephone Encounter (Signed)
Tried to call pt to set up pp appt but unable to leave message.  04-03-18  AS

## 2018-04-03 NOTE — Discharge Summary (Signed)
OB Discharge Summary     Patient Name: Autumn Ball DOB: 2001/07/04 MRN: [id number]  Date of admission: 04/01/2018 Delivering MD: Sharyon Cable   Date of discharge: 04/03/2018  Admitting diagnosis: 38 WKS, WATER LEAKING Intrauterine pregnancy: [redacted]w[redacted]d     Secondary diagnosis:  Active Problems:   Leakage of amniotic fluid   SVD (spontaneous vaginal delivery)  Additional problems: teen pregnancy, marijuana use, late prenatal care, trichomonas infection     Discharge diagnosis: Term Pregnancy Delivered                                                                                                Post partum procedures:none  Augmentation: AROM and Pitocin  Complications: None  Hospital course:  Onset of Labor With Vaginal Delivery     17 y.o. yo [id number] at [redacted]w[redacted]d was admitted in Latent Labor and SROM on 04/01/2018. Patient had an uncomplicated labor course as follows:  Membrane Rupture Time/Date: 12:02 PM ,04/01/2018   Intrapartum Procedures: Episiotomy: None [1]                                         Lacerations:  1st degree [2];Perineal [11];Labial [10]  Patient had a delivery of a Viable infant. 04/01/2018  Information for the patient's newborn:  Autumn, Ball [[id number]]  Delivery Method: Vaginal, Spontaneous(Filed from Delivery Summary)    Pateint had an uncomplicated postpartum course.  She is ambulating, tolerating a regular diet, passing flatus, and urinating well. Patient is discharged home in stable condition on 04/03/18.   Physical exam  Vitals:   04/02/18 0500 04/02/18 1428 04/02/18 1733 04/03/18 0500  BP: (!) 112/50 (!) 120/58 117/64 107/61  Pulse: 60 74 81 61  Resp: Temp: 98.4 F (36.9 C) 98.8 F (37.1 C) 98.1 F (36.7 C) 98.3 F (36.8 C)  TempSrc: Axillary Oral  Oral  SpO2: 100%   97%  Weight:      Height:       General: alert, cooperative and no distress Lochia: appropriate Uterine Fundus: firm Incision: Healing well with  no significant drainage, No significant erythema DVT Evaluation: No evidence of DVT seen on physical exam. Negative Homan's sign. No cords or calf tenderness. No significant calf/ankle edema. Labs: Lab Results  Component Value Date   WBC 8.7 04/01/2018   HGB 12.3 04/01/2018   HCT 37.7 04/01/2018   MCV 82.5 04/01/2018   PLT 262 04/01/2018   No flowsheet data found.  Discharge instruction: per After Visit Summary and "Baby and Me Booklet".  After visit meds:  Allergies as of 04/03/2018   No Known Allergies     Medication List    STOP taking these medications   metroNIDAZOLE 500 MG tablet Commonly known as:  FLAGYL     TAKE these medications   ibuprofen 600 MG tablet Commonly known as:  ADVIL,MOTRIN Take 1 tablet (600 mg total) by mouth every 6 (six) hours.   prenatal vitamin w/FE, FA 27-1 MG  Tabs tablet Take 1 tablet daily at 12 noon by mouth.       Diet: routine diet  Activity: Advance as tolerated. Pelvic rest for 6 weeks.   Outpatient follow up:6 weeks Follow up Appt:No future appointments. Follow up Visit:No follow-ups on file.  Postpartum contraception: Depo Provera  Newborn Data: Live born female  Birth Weight: 6 lb 5.1 oz (2865 g) APGAR: 9, 9  Newborn Delivery   Birth date/time:  04/01/2018 21:35:00 Delivery type:  Vaginal, Spontaneous     Baby Feeding: Bottle Disposition:home with mother   04/03/2018 Donette Larry, CNM

## 2018-05-04 ENCOUNTER — Encounter: Payer: Self-pay | Admitting: *Deleted

## 2018-05-04 ENCOUNTER — Ambulatory Visit: Payer: Medicaid Other | Admitting: Advanced Practice Midwife

## 2018-05-24 ENCOUNTER — Ambulatory Visit: Payer: Medicaid Other | Admitting: Women's Health

## 2018-07-11 ENCOUNTER — Ambulatory Visit: Payer: Medicaid Other | Admitting: Advanced Practice Midwife

## 2018-09-14 ENCOUNTER — Ambulatory Visit: Payer: Medicaid Other | Admitting: Women's Health

## 2018-11-20 DIAGNOSIS — H52223 Regular astigmatism, bilateral: Secondary | ICD-10-CM | POA: Diagnosis not present

## 2018-11-20 DIAGNOSIS — H5203 Hypermetropia, bilateral: Secondary | ICD-10-CM | POA: Diagnosis not present

## 2018-11-20 DIAGNOSIS — G44209 Tension-type headache, unspecified, not intractable: Secondary | ICD-10-CM | POA: Diagnosis not present

## 2018-11-22 DIAGNOSIS — H5213 Myopia, bilateral: Secondary | ICD-10-CM | POA: Diagnosis not present

## 2018-12-14 DIAGNOSIS — H52223 Regular astigmatism, bilateral: Secondary | ICD-10-CM | POA: Diagnosis not present

## 2018-12-14 DIAGNOSIS — H5203 Hypermetropia, bilateral: Secondary | ICD-10-CM | POA: Diagnosis not present

## 2018-12-21 NOTE — Progress Notes (Signed)
ERROR

## 2019-08-15 DIAGNOSIS — R509 Fever, unspecified: Secondary | ICD-10-CM | POA: Diagnosis not present

## 2019-08-15 DIAGNOSIS — Z20828 Contact with and (suspected) exposure to other viral communicable diseases: Secondary | ICD-10-CM | POA: Diagnosis not present

## 2019-08-15 DIAGNOSIS — R0981 Nasal congestion: Secondary | ICD-10-CM | POA: Diagnosis not present

## 2019-08-15 DIAGNOSIS — U071 COVID-19: Secondary | ICD-10-CM | POA: Diagnosis not present

## 2019-08-15 DIAGNOSIS — J3489 Other specified disorders of nose and nasal sinuses: Secondary | ICD-10-CM | POA: Diagnosis not present

## 2019-08-15 DIAGNOSIS — J329 Chronic sinusitis, unspecified: Secondary | ICD-10-CM | POA: Diagnosis not present

## 2019-08-15 DIAGNOSIS — H938X3 Other specified disorders of ear, bilateral: Secondary | ICD-10-CM | POA: Diagnosis not present

## 2019-09-28 ENCOUNTER — Telehealth: Payer: Self-pay | Admitting: Women's Health

## 2019-09-28 NOTE — Telephone Encounter (Signed)
Called patient regarding appointment and the following message was left on her mother's #, the patient's number kept saying call rejected:   We have you scheduled for an upcoming appointment at our office. At this time, we are still not allowing visitors or children during the appointment, however, a support person, over age 20, may accompany you to your appointment if assistance is needed for safety or care concerns. Otherwise, support persons should remain outside until the visit is complete.   We ask if you have had any exposure to anyone suspected or confirmed of having COVID-19 or if you are experiencing any of the following, to call and reschedule your appointment: fever, cough, shortness of breath, muscle pain, diarrhea, rash, vomiting, abdominal pain, red eye, weakness, bruising, bleeding, joint pain, or a severe headache.   Please know we will ask you these questions or similar questions when you arrive for your appointment and again its how we are keeping everyone safe.    Also,to keep you safe, please use the provided hand sanitizer when you enter the office. We are asking everyone in the office to wear a mask to help prevent the spread of germs. If you have a mask of your own, please wear it to your appointment, if not, we are happy to provide one for you.  Thank you for understanding and your cooperation.    [facility] Staff

## 2019-10-01 ENCOUNTER — Ambulatory Visit (INDEPENDENT_AMBULATORY_CARE_PROVIDER_SITE_OTHER): Payer: Medicaid Other | Admitting: Women's Health

## 2019-10-01 ENCOUNTER — Ambulatory Visit: Payer: Medicaid Other

## 2019-10-01 ENCOUNTER — Other Ambulatory Visit: Payer: Self-pay

## 2019-10-01 ENCOUNTER — Other Ambulatory Visit (HOSPITAL_COMMUNITY)
Admission: RE | Admit: 2019-10-01 | Discharge: 2019-10-01 | Disposition: A | Discharge status: Home / Self Care | Payer: Medicaid Other | Source: Ambulatory Visit | Attending: Obstetrics & Gynecology | Admitting: Obstetrics & Gynecology

## 2019-10-01 ENCOUNTER — Encounter: Payer: Self-pay | Admitting: Women's Health

## 2019-10-01 VITALS — BP 111/66 | HR 89 | Wt 109.8 lb

## 2019-10-01 DIAGNOSIS — Z113 Encounter for screening for infections with a predominantly sexual mode of transmission: Secondary | ICD-10-CM | POA: Diagnosis not present

## 2019-10-01 DIAGNOSIS — Z30013 Encounter for initial prescription of injectable contraceptive: Secondary | ICD-10-CM | POA: Diagnosis not present

## 2019-10-01 DIAGNOSIS — Z3202 Encounter for pregnancy test, result negative: Secondary | ICD-10-CM

## 2019-10-01 LAB — POCT URINE PREGNANCY: Preg Test, Ur: NEGATIVE

## 2019-10-01 MED ORDER — MEDROXYPROGESTERONE ACETATE 150 MG/ML IM SUSP: 150.0000 mg | INTRAMUSCULAR | 3 refills | Status: DC

## 2019-10-01 NOTE — Progress Notes (Signed)
   GYN VISIT Patient name: CARITA SOLLARS MRN [id number]  Date of birth: 2001/03/16 Chief Complaint:   Discuss Depo  History of Present Illness:   Autumn Ball is a 18 y.o. G33P2012 African American female being seen today for wanting to start depo. Last sex >2wks ago, has had period since. Had +trichomonas in 2019, no POC in Epic. Denies abnormal discharge, itching/odor/irritation.       Patient's last menstrual period was 09/06/2019. Review of Systems:   Pertinent items are noted in HPI Denies fever/chills, dizziness, headaches, visual disturbances, fatigue, shortness of breath, chest pain, abdominal pain, vomiting, abnormal vaginal discharge/itching/odor/irritation, problems with periods, bowel movements, urination, or intercourse unless otherwise stated above.  Pertinent History Reviewed:  Reviewed past medical,surgical, social, obstetrical and family history.  Reviewed problem list, medications and allergies. Physical Assessment:   Vitals:   10/01/19 0919  BP: 111/66  Pulse: 89  Weight: 109 lb 12.8 oz (49.8 kg)  Body mass index is 19.45 kg/m.       Physical Examination:   General appearance: alert, well appearing, and in no distress  Mental status: alert, oriented to person, place, and time  Skin: warm & dry   Cardiovascular: normal heart rate noted  Respiratory: normal respiratory effort, no distress  Abdomen: soft, non-tender   Pelvic: examination not indicated- pt collected self-swab  Extremities: no edema   Chaperone: n/a    Results for orders placed or performed in visit on 10/01/19 (from the past 24 hour(s))  POCT urine pregnancy   Collection Time: 10/01/19  9:26 AM  Result Value Ref Range   Preg Test, Ur Negative Negative    Assessment & Plan:  1) Contraception management> rx depo, condoms x 2wks  2) STD screen, trichomonas POC> vag swab sent  Meds:  Meds ordered this encounter  Medications  . medroxyPROGESTERone (DEPO-PROVERA) 150 MG/ML injection     Sig: Inject 1 mL (150 mg total) into the muscle every 3 (three) months.    Dispense:  1 mL    Refill:  3    Order Specific Question:   Supervising Provider    Answer:   Tania Ade H [[id number]]    Orders Placed This Encounter  Procedures  . POCT urine pregnancy    Return for today for , Depo injection.  [place], [facility] 10/01/2019 9:34 AM

## 2019-10-01 NOTE — Patient Instructions (Signed)
Medroxyprogesterone injection [Contraceptive] What is this medicine? MEDROXYPROGESTERONE (me DROX ee proe JES te rone) contraceptive injections prevent pregnancy. They provide effective birth control for 3 months. Depo-subQ Provera 104 is also used for treating pain related to endometriosis. This medicine may be used for other purposes; ask your health care provider or pharmacist if you have questions. COMMON BRAND NAME(S): Depo-Provera, Depo-subQ Provera 104 What should I tell my health care provider before I take this medicine? They need to know if you have any of these conditions:  frequently drink alcohol  asthma  blood vessel disease or a history of a blood clot in the lungs or legs  bone disease such as osteoporosis  breast cancer  diabetes  eating disorder (anorexia nervosa or bulimia)  high blood pressure  HIV infection or AIDS  kidney disease  liver disease  mental depression  migraine  seizures (convulsions)  stroke  tobacco smoker  vaginal bleeding  an unusual or allergic reaction to medroxyprogesterone, other hormones, medicines, foods, dyes, or preservatives  pregnant or trying to get pregnant  breast-feeding How should I use this medicine? Depo-Provera Contraceptive injection is given into a muscle. Depo-subQ Provera 104 injection is given under the skin. These injections are given by a health care professional. You must not be pregnant before getting an injection. The injection is usually given during the first 5 days after the start of a menstrual period or 6 weeks after delivery of a baby. Talk to your pediatrician regarding the use of this medicine in children. Special care may be needed. These injections have been used in female children who have started having menstrual periods. Overdosage: If you think you have taken too much of this medicine contact a poison control center or emergency room at once. NOTE: This medicine is only for you. Do not  share this medicine with others. What if I miss a dose? Try not to miss a dose. You must get an injection once every 3 months to maintain birth control. If you cannot keep an appointment, call and reschedule it. If you wait longer than 13 weeks between Depo-Provera contraceptive injections or longer than 14 weeks between Depo-subQ Provera 104 injections, you could get pregnant. Use another method for birth control if you miss your appointment. You may also need a pregnancy test before receiving another injection. What may interact with this medicine? Do not take this medicine with any of the following medications:  bosentan This medicine may also interact with the following medications:  aminoglutethimide  antibiotics or medicines for infections, especially rifampin, rifabutin, rifapentine, and griseofulvin  aprepitant  barbiturate medicines such as phenobarbital or primidone  bexarotene  carbamazepine  medicines for seizures like ethotoin, felbamate, oxcarbazepine, phenytoin, topiramate  modafinil  St. John's wort This list may not describe all possible interactions. Give your health care provider a list of all the medicines, herbs, non-prescription drugs, or dietary supplements you use. Also tell them if you smoke, drink alcohol, or use illegal drugs. Some items may interact with your medicine. What should I watch for while using this medicine? This drug does not protect you against HIV infection (AIDS) or other sexually transmitted diseases. Use of this product may cause you to lose calcium from your bones. Loss of calcium may cause weak bones (osteoporosis). Only use this product for more than 2 years if other forms of birth control are not right for you. The longer you use this product for birth control the more likely you will be at risk   for weak bones. Ask your health care professional how you can keep strong bones. You may have a change in bleeding pattern or irregular periods.  Many females stop having periods while taking this drug. If you have received your injections on time, your chance of being pregnant is very low. If you think you may be pregnant, see your health care professional as soon as possible. Tell your health care professional if you want to get pregnant within the next year. The effect of this medicine may last a long time after you get your last injection. What side effects may I notice from receiving this medicine? Side effects that you should report to your doctor or health care professional as soon as possible:  allergic reactions like skin rash, itching or hives, swelling of the face, lips, or tongue  breast tenderness or discharge  breathing problems  changes in vision  depression  feeling faint or lightheaded, falls  fever  pain in the abdomen, chest, groin, or leg  problems with balance, talking, walking  unusually weak or tired  yellowing of the eyes or skin Side effects that usually do not require medical attention (report to your doctor or health care professional if they continue or are bothersome):  acne  fluid retention and swelling  headache  irregular periods, spotting, or absent periods  temporary pain, itching, or skin reaction at site where injected  weight gain This list may not describe all possible side effects. Call your doctor for medical advice about side effects. You may report side effects to FDA at 1-800-FDA-1088. Where should I keep my medicine? This does not apply. The injection will be given to you by a health care professional. NOTE: This sheet is a summary. It may not cover all possible information. If you have questions about this medicine, talk to your doctor, pharmacist, or health care provider.  2020 Elsevier/Gold Standard (2008-11-22 18:37:56)  

## 2019-10-02 LAB — CERVICOVAGINAL ANCILLARY ONLY
Chlamydia: NEGATIVE
Comment: NEGATIVE
Comment: NEGATIVE
Comment: NORMAL
Neisseria Gonorrhea: NEGATIVE
Trichomonas: NEGATIVE

## 2019-10-31 ENCOUNTER — Telehealth: Payer: Self-pay | Admitting: Obstetrics & Gynecology

## 2019-10-31 NOTE — Telephone Encounter (Signed)

## 2019-11-01 ENCOUNTER — Other Ambulatory Visit: Payer: Self-pay

## 2019-11-01 ENCOUNTER — Ambulatory Visit (INDEPENDENT_AMBULATORY_CARE_PROVIDER_SITE_OTHER): Payer: Medicaid Other | Admitting: *Deleted

## 2019-11-01 DIAGNOSIS — Z3042 Encounter for surveillance of injectable contraceptive: Secondary | ICD-10-CM

## 2019-11-01 DIAGNOSIS — Z30013 Encounter for initial prescription of injectable contraceptive: Secondary | ICD-10-CM

## 2019-11-01 MED ORDER — MEDROXYPROGESTERONE ACETATE 150 MG/ML IM SUSP
150.0000 mg | Freq: Once | INTRAMUSCULAR | Status: AC
  Administered 2019-11-01: 10:00:00 150 mg via INTRAMUSCULAR

## 2019-11-01 NOTE — Progress Notes (Signed)
   NURSE VISIT- INJECTION  SUBJECTIVE:  Autumn Ball is a 18 y.o. G51P2012 female here for a Depo Provera for contraception/period management. She is a GYN patient.   OBJECTIVE:  There were no vitals taken for this visit.  Appears well, in no apparent distress  Injection administered in: Left upper quad. gluteus  No orders of the defined types were placed in this encounter.   ASSESSMENT: GYN patient Depo Provera for contraception/period management PLAN: Follow-up: in 11-13 weeks for next Depo   Infant Doane, Celene Squibb  11/01/2019 8:34 AM

## 2020-01-29 ENCOUNTER — Telehealth: Payer: Self-pay | Admitting: Obstetrics & Gynecology

## 2020-01-29 NOTE — Telephone Encounter (Signed)

## 2020-01-30 ENCOUNTER — Other Ambulatory Visit: Payer: Self-pay

## 2020-01-30 ENCOUNTER — Ambulatory Visit (INDEPENDENT_AMBULATORY_CARE_PROVIDER_SITE_OTHER): Payer: Medicaid Other

## 2020-01-30 VITALS — Ht 63.0 in | Wt 114.4 lb

## 2020-01-30 DIAGNOSIS — Z3042 Encounter for surveillance of injectable contraceptive: Secondary | ICD-10-CM | POA: Diagnosis not present

## 2020-01-30 MED ORDER — MEDROXYPROGESTERONE ACETATE 150 MG/ML IM SUSP
150.0000 mg | Freq: Once | INTRAMUSCULAR | Status: AC
  Administered 2020-01-30: 150 mg via INTRAMUSCULAR

## 2020-01-30 NOTE — Progress Notes (Signed)
   NURSE VISIT- INJECTION  SUBJECTIVE:  Autumn Ball is a 19 y.o. G50P2012 female here for  Depo injection is a GYN patient.   OBJECTIVE:  Ht 5\' 3"  (1.6 m)   Wt 114 lb 6.4 oz (51.9 kg)   LMP 01/14/2020   BMI 20.27 kg/m   Appears well, in no apparent distress  Injection administered in: Right upper quad. gluteus  Meds ordered this encounter  Medications  . medroxyPROGESTERone (DEPO-PROVERA) injection 150 mg    ASSESSMENT: GYN patient Depo Provera for contraception/period management PLAN: Follow-up: in 11-13 weeks for next Depo   01-02-1973  01/30/2020 4:23 PM

## 2020-03-04 ENCOUNTER — Ambulatory Visit (INDEPENDENT_AMBULATORY_CARE_PROVIDER_SITE_OTHER): Payer: Medicaid Other | Admitting: Obstetrics & Gynecology

## 2020-03-04 ENCOUNTER — Encounter: Payer: Self-pay | Admitting: Obstetrics & Gynecology

## 2020-03-04 ENCOUNTER — Other Ambulatory Visit: Payer: Self-pay | Admitting: Obstetrics & Gynecology

## 2020-03-04 ENCOUNTER — Other Ambulatory Visit: Payer: Self-pay

## 2020-03-04 VITALS — BP 135/81 | HR 89 | Ht 64.0 in | Wt 118.0 lb

## 2020-03-04 DIAGNOSIS — N907 Vulvar cyst: Secondary | ICD-10-CM | POA: Diagnosis not present

## 2020-03-04 DIAGNOSIS — L723 Sebaceous cyst: Secondary | ICD-10-CM

## 2020-03-04 NOTE — Addendum Note (Signed)
Addended by: Lynnell Dike on: 03/04/2020 03:41 PM   Modules accepted: Orders

## 2020-03-04 NOTE — Progress Notes (Signed)
Sebaceous Cyst Excision Procedure Note  Pre-operative Diagnosis: Right vulvar sebaceous cyst  Post-operative Diagnosis: same  Locations: right vulva  Indications: uncomfortable  Anesthesia: Lidocaine 1% without epinephrine without added sodium bicarbonate  Procedure Details  History of allergy to iodine: no  Patient informed of the risks (including bleeding and infection) and benefits of the  procedure and Written informed consent obtained.  The lesion and surrounding area was given a sterile prep using betadyne and draped in the usual sterile fashion. An incision was made over the cyst, which was dissected free of the surrounding tissue and removed.  The cyst was filled with typical sebaceous material.  The wound was closed with 3-0 ethilon using simple interrupted stitches. Antibiotic ointment and a sterile dressing applied.  The specimen was sent for pathologic examination. The patient tolerated the procedure well.  EBL: 5 ml  Findings: 1.5 cm right vlvar sebaceous cyst  Condition: Stable  Complications: none.  Plan: 1. Instructed to keep the wound dry and covered for 24-48h and clean thereafter. 2. Warning signs of infection were reviewed.   3. Recommended that the patient use OTC ibuprofen as needed for pain.  4. Return for suture removal in 9 days.

## 2020-03-13 ENCOUNTER — Ambulatory Visit (INDEPENDENT_AMBULATORY_CARE_PROVIDER_SITE_OTHER): Payer: Medicaid Other | Admitting: Obstetrics & Gynecology

## 2020-03-13 ENCOUNTER — Encounter: Payer: Self-pay | Admitting: Obstetrics & Gynecology

## 2020-03-13 ENCOUNTER — Other Ambulatory Visit: Payer: Self-pay

## 2020-03-13 VITALS — BP 125/82 | HR 109 | Ht 64.0 in | Wt 118.0 lb

## 2020-03-13 DIAGNOSIS — Z9889 Other specified postprocedural states: Secondary | ICD-10-CM

## 2020-03-13 MED ORDER — MEGESTROL ACETATE 40 MG PO TABS: ORAL_TABLET | ORAL | 0 refills | Status: DC

## 2020-03-13 NOTE — Progress Notes (Signed)
  HPI: Patient returns for routine postoperative follow-up having undergone removal of vulvovaginal cyst on 1 week ago.  The patient's immediate postoperative recovery has been unremarkable. Since hospital discharge the patient reports soreness.   Current Outpatient Medications: .  medroxyPROGESTERone (DEPO-PROVERA) 150 MG/ML injection, Inject 1 mL (150 mg total) into the muscle every 3 (three) months., Disp: 1 mL, Rfl: 3  No current facility-administered medications for this visit.    Blood pressure 125/82, pulse (!) 109, height 5\' 4"  (1.626 m), weight 118 lb (53.5 kg), last menstrual period 02/27/2020, not currently breastfeeding.  Physical Exam: Incision healing well no s/s infection  Sutures x 3 removed  Diagnostic Tests:   Pathology: benign  Impression: S/p removal of perineal vulvovaginal epidermal inclusion cyst  Plan:   Follow up: prn    02/29/2020, MD

## 2020-04-16 ENCOUNTER — Ambulatory Visit: Payer: Medicaid Other

## 2020-05-15 ENCOUNTER — Other Ambulatory Visit: Payer: Self-pay

## 2020-05-15 ENCOUNTER — Encounter: Payer: Self-pay | Admitting: Adult Health

## 2020-05-15 ENCOUNTER — Ambulatory Visit (INDEPENDENT_AMBULATORY_CARE_PROVIDER_SITE_OTHER): Payer: Medicaid Other | Admitting: Adult Health

## 2020-05-15 VITALS — BP 123/67 | HR 81 | Ht 63.0 in | Wt 118.5 lb

## 2020-05-15 DIAGNOSIS — Z3202 Encounter for pregnancy test, result negative: Secondary | ICD-10-CM | POA: Diagnosis not present

## 2020-05-15 DIAGNOSIS — Z30016 Encounter for initial prescription of transdermal patch hormonal contraceptive device: Secondary | ICD-10-CM | POA: Insufficient documentation

## 2020-05-15 LAB — POCT URINE PREGNANCY: Preg Test, Ur: NEGATIVE

## 2020-05-15 MED ORDER — NORELGESTROMIN-ETH ESTRADIOL 150-35 MCG/24HR TD PTWK: 1.0000 | MEDICATED_PATCH | TRANSDERMAL | 12 refills | Status: DC

## 2020-05-15 NOTE — Progress Notes (Signed)
  Subjective:     Patient ID: Autumn Ball, female   DOB: 2001-04-25, 19 y.o.   MRN: [id number]  HPI Autumn Ball is a 19 year old black female, single,G3P2012 in to discuss birth control, she had been on depo with last injection 01/30/20 and had spotting, she thinks she wants the patch. PCP is Dr Jeanice Lim.  Review of Systems  Patient denies any headaches, hearing loss, fatigue, blurred vision, shortness of breath, chest pain, abdominal pain, problems with bowel movements, urination, or intercourse. No joint pain or mood swings. Spotted with depo  Reviewed past medical,surgical, social and family history. Reviewed medications and allergies.     Objective:   Physical Exam BP 123/67 (BP Location: Left Arm, Patient Position: Sitting, Cuff Size: Normal)   Pulse 81   Ht 5\' 3"  (1.6 m)   Wt 118 lb 8 oz (53.8 kg)   BMI 20.99 kg/m   UPT is negative. Skin warm and dry. Neck: mid line trachea, normal thyroid, good ROM, no lymphadenopathy noted. Lungs: clear to ausculation bilaterally. Cardiovascular: regular rate and rhythm.   Upstream - 05/15/20 1605      Pregnancy Intention Screening   Does the patient want to become pregnant in the next year? No    Does the patient's partner want to become pregnant in the next year? No    Would the patient like to discuss contraceptive options today? Yes      Contraception Wrap Up   Current Method Female Condom    End Method Contraceptive Patch             Assessment:     1. Encounter for initial prescription of transdermal patch hormonal contraceptive device Discussed the patch and that is what is wants  Will rx the patch, place on today, use condoms at least 1 month Meds ordered this encounter  Medications  . norelgestromin-ethinyl estradiol (ORTHO EVRA) 150-35 MCG/24HR transdermal patch    Sig: Place 1 patch onto the skin once a week.    Dispense:  3 patch    Refill:  12    Order Specific Question:   Supervising Provider    Answer:   07/16/20 [[id number]]      Plan:     Return in 5 months for pap and physical or sooner if needed

## 2020-09-15 ENCOUNTER — Other Ambulatory Visit: Payer: Medicaid Other

## 2020-09-24 ENCOUNTER — Other Ambulatory Visit: Payer: Medicaid Other

## 2021-03-01 NOTE — Progress Notes (Deleted)
   WELL-WOMAN EXAMINATION Patient name: Autumn Ball MRN [id number]  Date of birth: 03-Jul-2001 Chief Complaint:   No chief complaint on file.  History of Present Illness:   Autumn Ball is a 20 y.o. [id number] {Race/ethnicity:17218} female being seen today for a routine well-woman exam.  Today she notes: ***  Previously had sebaceous cyst removal 02/2020- benign  No LMP recorded. Patient has had an injection. Denies issues with her menses The current method of family planning is Ortho-Evra patches weekly.     Depression screen [facility] 2/9 10/01/2019 02/01/2018  Decreased Interest 0 0  Down, Depressed, Hopeless 0 0  PHQ - 2 Score 0 0  Altered sleeping - 0  Tired, decreased energy - 1  Change in appetite - 0  Feeling bad or failure about yourself  - 0  Trouble concentrating - 0  Suicidal thoughts - 0  PHQ-9 Score - 1      Review of Systems:   Pertinent items are noted in HPI Denies any headaches, blurred vision, fatigue, shortness of breath, chest pain, abdominal pain, bowel movements, urination, or intercourse unless otherwise stated above.  Pertinent History Reviewed:  Reviewed past medical,surgical, social and family history.  Reviewed problem list, medications and allergies. Physical Assessment:  There were no vitals filed for this visit.There is no height or weight on file to calculate BMI.        Physical Examination:   General appearance - well appearing, and in no distress  Mental status - alert, oriented to person, place, and time  Psych:  She has a normal mood and affect  Skin - warm and dry, normal color, no suspicious lesions noted  Chest - effort normal, all lung fields clear to auscultation bilaterally  Heart - normal rate and regular rhythm  Neck:  midline trachea, no thyromegaly or nodules  Breasts - breasts appear normal, no suspicious masses, no skin or nipple changes or  axillary nodes  Abdomen - soft, nontender, nondistended, no masses or  organomegaly  Pelvic - VULVA: normal appearing vulva with no masses, tenderness or lesions  VAGINA: normal appearing vagina with normal color and discharge, no lesions  CERVIX: normal appearing cervix without discharge or lesions, no CMT  Thin prep pap is {Desc; done/not:10129} *** HR HPV cotesting  UTERUS: uterus is felt to be normal size, shape, consistency and nontender   ADNEXA: No adnexal masses or tenderness noted.  Rectal - normal rectal, good sphincter tone, no masses felt. Hemoccult: ***  Extremities:  No swelling or varicosities noted  Chaperone: {Chaperone:19197::"N/A","Latisha Cresenzo","Janet Young","Amanda Andrews","Peggy Dones","Nicole Jones","Angel Neas"}     Assessment & Plan:  1) Well-Woman Exam ***  2) ***  No orders of the defined types were placed in this encounter.   Meds: No orders of the defined types were placed in this encounter.   Follow-up: No follow-ups on file.   Myna Hidalgo, DO Attending Obstetrician & Gynecologist, [facility] for [employer], [facility]

## 2021-03-04 ENCOUNTER — Other Ambulatory Visit: Payer: Medicaid Other | Admitting: Obstetrics & Gynecology

## 2021-07-03 ENCOUNTER — Other Ambulatory Visit: Payer: Medicaid Other | Admitting: Obstetrics & Gynecology

## 2021-07-19 ENCOUNTER — Ambulatory Visit: Payer: Self-pay

## 2021-10-15 ENCOUNTER — Ambulatory Visit (INDEPENDENT_AMBULATORY_CARE_PROVIDER_SITE_OTHER): Payer: Medicaid Other | Admitting: Adult Health

## 2021-10-15 ENCOUNTER — Encounter: Payer: Self-pay | Admitting: Adult Health

## 2021-10-15 ENCOUNTER — Other Ambulatory Visit (HOSPITAL_COMMUNITY)
Admission: RE | Admit: 2021-10-15 | Discharge: 2021-10-15 | Disposition: A | Discharge status: Home / Self Care | Payer: Medicaid Other | Source: Ambulatory Visit | Attending: Adult Health | Admitting: Adult Health

## 2021-10-15 ENCOUNTER — Other Ambulatory Visit: Payer: Self-pay

## 2021-10-15 VITALS — BP 125/74 | HR 80 | Ht 63.0 in | Wt 125.5 lb

## 2021-10-15 DIAGNOSIS — B9689 Other specified bacterial agents as the cause of diseases classified elsewhere: Secondary | ICD-10-CM | POA: Diagnosis not present

## 2021-10-15 DIAGNOSIS — Z124 Encounter for screening for malignant neoplasm of cervix: Secondary | ICD-10-CM | POA: Insufficient documentation

## 2021-10-15 DIAGNOSIS — Z30016 Encounter for initial prescription of transdermal patch hormonal contraceptive device: Secondary | ICD-10-CM | POA: Diagnosis not present

## 2021-10-15 DIAGNOSIS — Z3202 Encounter for pregnancy test, result negative: Secondary | ICD-10-CM | POA: Diagnosis not present

## 2021-10-15 DIAGNOSIS — N760 Acute vaginitis: Secondary | ICD-10-CM

## 2021-10-15 DIAGNOSIS — N898 Other specified noninflammatory disorders of vagina: Secondary | ICD-10-CM | POA: Diagnosis not present

## 2021-10-15 LAB — POCT WET PREP (WET MOUNT)
Clue Cells Wet Prep Whiff POC: POSITIVE
Trichomonas Wet Prep HPF POC: ABSENT
WBC, Wet Prep HPF POC: POSITIVE

## 2021-10-15 LAB — POCT URINE PREGNANCY: Preg Test, Ur: NEGATIVE

## 2021-10-15 MED ORDER — NORELGESTROMIN-ETH ESTRADIOL 150-35 MCG/24HR TD PTWK: 1.0000 | MEDICATED_PATCH | TRANSDERMAL | 12 refills | Status: DC

## 2021-10-15 MED ORDER — METRONIDAZOLE 500 MG PO TABS: 500.0000 mg | ORAL_TABLET | Freq: Two times a day (BID) | ORAL | 0 refills | Status: DC

## 2021-10-15 NOTE — Progress Notes (Signed)
  Subjective:     Patient ID: Autumn Ball, female   DOB: 2001-01-05, 20 y.o.   MRN: [id number]  HPI Autumn Ball is a 20 year old black female,single, G3P2012, in wanting to discuss birth control, wants the patch, has used in past. Has vaginal discharge, wants to do swab but needs pap, will do pap with GC/CHL.  Review of Systems +vaginal discharge Denies any itching or burning  Reviewed past medical,surgical, social and family history. Reviewed medications and allergies.     Objective:   Physical Exam BP 125/74 (BP Location: Left Arm, Patient Position: Sitting, Cuff Size: Normal)   Pulse 80   Ht 5\' 3"  (1.6 m)   Wt 125 lb 8 oz (56.9 kg)   LMP 09/28/2021 (Approximate)   BMI 22.23 kg/m     UPT is negative Skin warm and dry.Pelvic: external genitalia is normal in appearance no lesions, vagina: creamy discharge with odor,urethra has no lesions or masses noted, cervix:smooth and bulbous, pap with GC/CHL performed, uterus: normal size, shape and contour, non tender, no masses felt, adnexa: no masses or tenderness noted. Bladder is non tender and no masses felt. Wet prep: + for clue cells and +WBCs.  Fall risk is low  Upstream - 10/15/21 1403       Pregnancy Intention Screening   Does the patient want to become pregnant in the next year? No    Does the patient's partner want to become pregnant in the next year? No    Would the patient like to discuss contraceptive options today? Yes      Contraception Wrap Up   Current Method No Method - Other Reason    End Method Contraceptive Patch    Contraception Counseling Provided Yes            Examination chaperoned by 14/01/22 LPN.  Assessment:     1. Pregnancy examination or test, negative result  2. Routine cervical smear Pap sent with GC/CHL  3. Encounter for initial prescription of transdermal patch hormonal contraceptive device Will rx xulane, can place now or tomorrow,use condoms for 1 month  Meds ordered this encounter   Medications   norelgestromin-ethinyl estradiol Malachy Mood) 150-35 MCG/24HR transdermal patch    Sig: Place 1 patch onto the skin once a week.    Dispense:  3 patch    Refill:  12    Order Specific Question:   Supervising Provider    Answer:   Burr Medico H [[id number]]   metroNIDAZOLE (FLAGYL) 500 MG tablet    Sig: Take 1 tablet (500 mg total) by mouth 2 (two) times daily.    Dispense:  14 tablet    Refill:  0    Order Specific Question:   Supervising Provider    Answer:   Duane Lope, LUTHER H [[id number]]   Follow up in 3 months for ROS and BP check   4. Vaginal discharge   5. Vaginal odor   6. BV (bacterial vaginosis) Will rx flagyl,no sex or alcohol during treatment     Plan:     Physical in 1 year

## 2021-10-20 ENCOUNTER — Telehealth: Payer: Self-pay | Admitting: Adult Health

## 2021-10-20 ENCOUNTER — Encounter: Payer: Self-pay | Admitting: Adult Health

## 2021-10-20 DIAGNOSIS — A599 Trichomoniasis, unspecified: Secondary | ICD-10-CM

## 2021-10-20 DIAGNOSIS — A749 Chlamydial infection, unspecified: Secondary | ICD-10-CM

## 2021-10-20 HISTORY — DX: Chlamydial infection, unspecified: A74.9

## 2021-10-20 LAB — CYTOLOGY - PAP
Chlamydia: POSITIVE — AB
Comment: NEGATIVE
Comment: NORMAL
Diagnosis: NEGATIVE
Neisseria Gonorrhea: NEGATIVE

## 2021-10-20 MED ORDER — DOXYCYCLINE HYCLATE 100 MG PO TABS: 100.0000 mg | ORAL_TABLET | Freq: Two times a day (BID) | ORAL | 0 refills | Status: DC

## 2021-10-20 NOTE — Telephone Encounter (Signed)
No answer

## 2022-01-13 ENCOUNTER — Ambulatory Visit: Payer: Medicaid Other | Admitting: Adult Health

## 2022-03-31 ENCOUNTER — Ambulatory Visit: Payer: Self-pay

## 2022-06-14 ENCOUNTER — Other Ambulatory Visit (HOSPITAL_COMMUNITY)
Admission: RE | Admit: 2022-06-14 | Discharge: 2022-06-14 | Disposition: A | Discharge status: Home / Self Care | Payer: Medicaid Other | Source: Ambulatory Visit | Attending: Obstetrics & Gynecology | Admitting: Obstetrics & Gynecology

## 2022-06-14 ENCOUNTER — Other Ambulatory Visit (INDEPENDENT_AMBULATORY_CARE_PROVIDER_SITE_OTHER): Payer: Medicaid Other | Admitting: *Deleted

## 2022-06-14 DIAGNOSIS — Z113 Encounter for screening for infections with a predominantly sexual mode of transmission: Secondary | ICD-10-CM | POA: Diagnosis not present

## 2022-06-14 NOTE — Progress Notes (Signed)
   NURSE VISIT- VAGINITIS/STD/POC  SUBJECTIVE:  Autumn Ball is a 21 y.o. [id number] GYN patientfemale here for a vaginal swab for STD screen.  She reports the following symptoms: none for 0 days. Denies abnormal vaginal bleeding, significant pelvic pain, fever, or UTI symptoms.  OBJECTIVE:  There were no vitals taken for this visit.  Appears well, in no apparent distress  ASSESSMENT: Vaginal swab for STD screen  PLAN: Self-collected vaginal probe for Gonorrhea, Chlamydia, Trichomonas, Bacterial Vaginosis, Yeast sent to lab Treatment: to be determined once results are received Follow-up as needed if symptoms persist/worsen, or new symptoms develop  Annamarie Dawley  06/14/2022 10:48 AM

## 2022-06-15 ENCOUNTER — Other Ambulatory Visit: Payer: Self-pay | Admitting: Adult Health

## 2022-06-15 DIAGNOSIS — A749 Chlamydial infection, unspecified: Secondary | ICD-10-CM

## 2022-06-15 LAB — CERVICOVAGINAL ANCILLARY ONLY
Bacterial Vaginitis (gardnerella): POSITIVE — AB
Candida Glabrata: NEGATIVE
Candida Vaginitis: NEGATIVE
Chlamydia: POSITIVE — AB
Comment: NEGATIVE
Comment: NEGATIVE
Comment: NEGATIVE
Comment: NEGATIVE
Comment: NEGATIVE
Comment: NORMAL
Neisseria Gonorrhea: NEGATIVE
Trichomonas: NEGATIVE

## 2022-06-15 MED ORDER — METRONIDAZOLE 500 MG PO TABS: 500.0000 mg | ORAL_TABLET | Freq: Two times a day (BID) | ORAL | 0 refills | Status: DC

## 2022-06-15 MED ORDER — DOXYCYCLINE HYCLATE 100 MG PO TABS: 100.0000 mg | ORAL_TABLET | Freq: Two times a day (BID) | ORAL | 0 refills | Status: DC

## 2022-06-15 NOTE — Progress Notes (Signed)
+  BV and chlamydia on vaginal swab will rx flagyl 500 mg 1 bid x 7 days  and doxycycline 100 mg 1 bid x 7 days, no sex or alcohol while taking, needs proof of treatment  in 4 weeks and partner needs to be treated for chlamydia. [facility] sent

## 2022-07-12 ENCOUNTER — Other Ambulatory Visit: Payer: Medicaid Other

## 2022-08-31 ENCOUNTER — Other Ambulatory Visit (HOSPITAL_COMMUNITY)
Admission: RE | Admit: 2022-08-31 | Discharge: 2022-08-31 | Disposition: A | Discharge status: Home / Self Care | Payer: Medicaid Other | Source: Ambulatory Visit | Attending: Obstetrics & Gynecology | Admitting: Obstetrics & Gynecology

## 2022-08-31 ENCOUNTER — Other Ambulatory Visit (INDEPENDENT_AMBULATORY_CARE_PROVIDER_SITE_OTHER): Payer: Medicaid Other | Admitting: *Deleted

## 2022-08-31 DIAGNOSIS — Z113 Encounter for screening for infections with a predominantly sexual mode of transmission: Secondary | ICD-10-CM | POA: Diagnosis not present

## 2022-08-31 NOTE — Progress Notes (Signed)
   NURSE VISIT- VAGINITIS/STD/POC  SUBJECTIVE:  Autumn Ball is a 21 y.o. [id number] GYN patientfemale here for a vaginal swab for STD screen.  She reports the following symptoms: none for 0 days. Denies abnormal vaginal bleeding, significant pelvic pain, fever, or UTI symptoms.  OBJECTIVE:  There were no vitals taken for this visit.  Appears well, in no apparent distress  ASSESSMENT: Vaginal swab for STD screen  PLAN: Self-collected vaginal probe for Gonorrhea, Chlamydia, Trichomonas, Bacterial Vaginosis, Yeast sent to lab Treatment: to be determined once results are received Follow-up as needed if symptoms persist/worsen, or new symptoms develop  Autumn Ball  08/31/2022 10:33 AM

## 2022-09-01 LAB — CERVICOVAGINAL ANCILLARY ONLY
Candida Glabrata: NEGATIVE
Candida Vaginitis: NEGATIVE
Chlamydia: NEGATIVE
Comment: NEGATIVE
Comment: NEGATIVE
Comment: NEGATIVE
Comment: NEGATIVE
Comment: NORMAL
Neisseria Gonorrhea: NEGATIVE
Trichomonas: NEGATIVE

## 2022-12-24 ENCOUNTER — Other Ambulatory Visit: Payer: Self-pay | Admitting: Adult Health

## 2022-12-24 ENCOUNTER — Other Ambulatory Visit (INDEPENDENT_AMBULATORY_CARE_PROVIDER_SITE_OTHER): Payer: Medicaid Other

## 2022-12-24 DIAGNOSIS — R319 Hematuria, unspecified: Secondary | ICD-10-CM

## 2022-12-24 DIAGNOSIS — R350 Frequency of micturition: Secondary | ICD-10-CM

## 2022-12-24 DIAGNOSIS — M545 Low back pain, unspecified: Secondary | ICD-10-CM | POA: Diagnosis not present

## 2022-12-24 DIAGNOSIS — R300 Dysuria: Secondary | ICD-10-CM | POA: Diagnosis not present

## 2022-12-24 LAB — POCT URINALYSIS DIPSTICK OB
Glucose, UA: NEGATIVE
Ketones, UA: NEGATIVE
Leukocytes, UA: NEGATIVE
Nitrite, UA: NEGATIVE

## 2022-12-24 MED ORDER — SULFAMETHOXAZOLE-TRIMETHOPRIM 800-160 MG PO TABS: 1.0000 | ORAL_TABLET | Freq: Two times a day (BID) | ORAL | 0 refills | Status: DC

## 2022-12-24 NOTE — Progress Notes (Signed)
Rx septra ds 

## 2022-12-24 NOTE — Progress Notes (Signed)
   NURSE VISIT- UTI SYMPTOMS   SUBJECTIVE:  Autumn Ball is a 22 y.o. G28P2012 female here for UTI symptoms. She is a GYN patient. She reports dysuria, flank pain bilaterally, hematuria, and lower abdominal pain, urinary frequency for 4 days.  States the back pain was so bad earlier this week, she could not get out of bed  OBJECTIVE:  There were no vitals taken for this visit.  Appears well, in no apparent distress  Results for orders placed or performed in visit on 12/24/22 (from the past 24 hour(s))  POC Urinalysis Dipstick OB   Collection Time: 12/24/22 12:10 PM  Result Value Ref Range   Color, UA     Clarity, UA     Glucose, UA Negative Negative   Bilirubin, UA     Ketones, UA neg    Spec Grav, UA     Blood, UA large    pH, UA     POC,PROTEIN,UA Moderate (2+) Negative, Trace, Small (1+), Moderate (2+), Large (3+), 4+   Urobilinogen, UA     Nitrite, UA neg    Leukocytes, UA Negative Negative   Appearance     Odor      ASSESSMENT: GYN patient with UTI symptoms and negative nitrites  PLAN: Note routed to Derrek Monaco, AGNP   Rx sent by provider today: Ppt requesting prescription be sent due to discomfort. Aware she made need a different antibiotic  Urine culture sent Call or return to clinic prn if these symptoms worsen or fail to improve as anticipated. Follow-up: as needed   Alice Rieger  12/24/2022 12:12 PM

## 2022-12-25 LAB — URINALYSIS, ROUTINE W REFLEX MICROSCOPIC
Bilirubin, UA: NEGATIVE
Glucose, UA: NEGATIVE
Ketones, UA: NEGATIVE
Nitrite, UA: NEGATIVE
Specific Gravity, UA: 1.022 (ref 1.005–1.030)
Urobilinogen, Ur: 0.2 mg/dL (ref 0.2–1.0)
pH, UA: 6 (ref 5.0–7.5)

## 2022-12-25 LAB — MICROSCOPIC EXAMINATION
Casts: NONE SEEN /lpf
Epithelial Cells (non renal): >10 /hpf — AB (ref 0–10)
RBC, Urine: >30 /hpf — AB (ref 0–2)

## 2022-12-28 LAB — URINE CULTURE

## 2024-04-16 ENCOUNTER — Other Ambulatory Visit (HOSPITAL_COMMUNITY)
Admission: RE | Admit: 2024-04-16 | Discharge: 2024-04-16 | Disposition: A | Discharge status: Home / Self Care | Payer: Self-pay | Source: Ambulatory Visit | Attending: Obstetrics & Gynecology | Admitting: Obstetrics & Gynecology

## 2024-04-16 ENCOUNTER — Ambulatory Visit: Payer: Self-pay

## 2024-04-16 VITALS — BP 127/66 | HR 111 | Ht 63.0 in | Wt 123.0 lb

## 2024-04-16 DIAGNOSIS — N926 Irregular menstruation, unspecified: Secondary | ICD-10-CM

## 2024-04-16 DIAGNOSIS — Z113 Encounter for screening for infections with a predominantly sexual mode of transmission: Secondary | ICD-10-CM

## 2024-04-16 DIAGNOSIS — Z3202 Encounter for pregnancy test, result negative: Secondary | ICD-10-CM

## 2024-04-16 LAB — POCT URINE PREGNANCY: Preg Test, Ur: NEGATIVE

## 2024-04-16 NOTE — Progress Notes (Signed)
   NURSE VISIT- STD SUBJECTIVE:  Autumn Ball is a 23 y.o. [id number] GYN patientfemale here for a vaginal swab for vaginitis screening, STD screen.  She reports the following symptoms: none for 0 days. Denies abnormal vaginal bleeding, significant pelvic pain, fever, or UTI symptoms. Patient also request pregnancy test due to irregular period.   OBJECTIVE:  BP 127/66 (BP Location: Right Arm, Patient Position: Sitting, Cuff Size: Normal)   Pulse (!) 111   Ht 5\' 3"  (1.6 m)   Wt 123 lb (55.8 kg)   LMP 03/31/2024 (Exact Date)   BMI 21.79 kg/m   Appears well, in no apparent distress  ASSESSMENT: Vaginal swab for STD screen Pregnancy test Negative  PLAN: Self-collected vaginal probe for Gonorrhea, Chlamydia, Trichomonas, Bacterial Vaginosis, Yeast sent to lab Pregnancy test negative Treatment: to be determined once results are received Follow-up as needed if symptoms persist/worsen, or new symptoms develop  Alyssa Jumper  04/16/2024 12:09 PM

## 2024-04-17 ENCOUNTER — Ambulatory Visit: Payer: Self-pay | Admitting: Adult Health

## 2024-04-17 LAB — CERVICOVAGINAL ANCILLARY ONLY
Bacterial Vaginitis (gardnerella): POSITIVE — AB
Candida Glabrata: NEGATIVE
Candida Vaginitis: NEGATIVE
Chlamydia: NEGATIVE
Comment: NEGATIVE
Comment: NEGATIVE
Comment: NEGATIVE
Comment: NEGATIVE
Comment: NEGATIVE
Comment: NORMAL
Neisseria Gonorrhea: NEGATIVE
Trichomonas: NEGATIVE

## 2024-04-17 MED ORDER — METRONIDAZOLE 500 MG PO TABS: 500.0000 mg | ORAL_TABLET | Freq: Two times a day (BID) | ORAL | 0 refills | Status: DC

## 2024-09-14 ENCOUNTER — Other Ambulatory Visit (HOSPITAL_COMMUNITY)
Admission: RE | Admit: 2024-09-14 | Discharge: 2024-09-14 | Disposition: A | Discharge status: Home / Self Care | Payer: Self-pay | Source: Ambulatory Visit | Attending: Obstetrics & Gynecology | Admitting: Obstetrics & Gynecology

## 2024-09-14 ENCOUNTER — Ambulatory Visit (INDEPENDENT_AMBULATORY_CARE_PROVIDER_SITE_OTHER): Payer: Self-pay | Admitting: *Deleted

## 2024-09-14 DIAGNOSIS — Z113 Encounter for screening for infections with a predominantly sexual mode of transmission: Secondary | ICD-10-CM | POA: Insufficient documentation

## 2024-09-14 NOTE — Progress Notes (Signed)
   NURSE VISIT- VAGINITIS/STD/POC  SUBJECTIVE:  Autumn Ball is a 23 y.o. [id number] GYN patientfemale here for a vaginal swab for STD screen.  She reports the following symptoms: none for 0 days. Denies abnormal vaginal bleeding, significant pelvic pain, fever, or UTI symptoms.  OBJECTIVE:  There were no vitals taken for this visit.  Appears well, in no apparent distress  ASSESSMENT: Vaginal swab for STD screen  PLAN: Self-collected vaginal probe for Gonorrhea, Chlamydia, Trichomonas, Bacterial Vaginosis, Yeast sent to lab Treatment: to be determined once results are received Follow-up as needed if symptoms persist/worsen, or new symptoms develop  Alan CROME Fischer  09/14/2024 10:51 AM

## 2024-09-17 ENCOUNTER — Ambulatory Visit: Payer: Self-pay | Admitting: Adult Health

## 2024-09-17 LAB — CERVICOVAGINAL ANCILLARY ONLY
Bacterial Vaginitis (gardnerella): POSITIVE — AB
Candida Glabrata: NEGATIVE
Candida Vaginitis: NEGATIVE
Chlamydia: NEGATIVE
Comment: NEGATIVE
Comment: NEGATIVE
Comment: NEGATIVE
Comment: NEGATIVE
Comment: NEGATIVE
Comment: NORMAL
Neisseria Gonorrhea: NEGATIVE
Trichomonas: NEGATIVE

## 2024-09-20 ENCOUNTER — Other Ambulatory Visit: Payer: Self-pay | Admitting: Adult Health

## 2024-09-20 MED ORDER — METRONIDAZOLE 500 MG PO TABS: 500.0000 mg | ORAL_TABLET | Freq: Two times a day (BID) | ORAL | 0 refills | Status: AC

## 2024-09-20 NOTE — Progress Notes (Signed)
 Rx flagyl

## 2024-10-28 ENCOUNTER — Encounter: Payer: Self-pay | Admitting: Emergency Medicine

## 2024-10-28 ENCOUNTER — Ambulatory Visit
Admission: EM | Admit: 2024-10-28 | Discharge: 2024-10-28 | Disposition: A | Discharge status: Home / Self Care | Payer: Self-pay

## 2024-10-28 ENCOUNTER — Other Ambulatory Visit: Payer: Self-pay

## 2024-10-28 DIAGNOSIS — R509 Fever, unspecified: Secondary | ICD-10-CM

## 2024-10-28 DIAGNOSIS — J4521 Mild intermittent asthma with (acute) exacerbation: Secondary | ICD-10-CM

## 2024-10-28 DIAGNOSIS — J069 Acute upper respiratory infection, unspecified: Secondary | ICD-10-CM

## 2024-10-28 MED ORDER — AZELASTINE HCL 0.1 % NA SOLN: 1.0000 | Freq: Two times a day (BID) | NASAL | 0 refills | Status: AC

## 2024-10-28 MED ORDER — PROMETHAZINE-DM 6.25-15 MG/5ML PO SYRP: 5.0000 mL | ORAL_SOLUTION | Freq: Four times a day (QID) | ORAL | 0 refills | Status: AC | PRN

## 2024-10-28 NOTE — ED Provider Notes (Signed)
 [facility]    CSN: [id number] Arrival date & time: 10/28/24  0845      History   Chief Complaint Chief Complaint  Patient presents with   Headache    HPI Autumn Ball is a 23 y.o. female.   Patient presenting today with several day history of fever, chills, headache, sore throat, ear pressure and popping, wheezing, cough, congestion.  Denies chest pain, shortness of breath, abdominal pain, vomiting, diarrhea.  So far tried Tylenol  and Mucinex with minimal relief.  History of seasonal allergies and asthma on as needed regimen for both.  Has not yet been using her albuterol  for symptoms.    Past Medical History:  Diagnosis Date   Allergy    Asthma    inhaler as needed but not used in a couple of years   Burning with urination 01/19/2016   Chlamydia 10/20/2021   Treated 10/20/21, POC   Contraceptive management 06/11/2015   Miscarriage    UTI (lower urinary tract infection) 01/19/2016    Patient Active Problem List   Diagnosis Date Noted   Chlamydia 10/20/2021   BV (bacterial vaginosis) 10/15/2021   Vaginal odor 10/15/2021   Vaginal discharge 10/15/2021   Routine cervical smear 10/15/2021   Pregnancy examination or test, negative result 10/15/2021   Encounter for initial prescription of transdermal patch hormonal contraceptive device 05/15/2020   Trichomonas infection 02/01/2018   Marijuana use 03/08/2016    Past Surgical History:  Procedure Laterality Date   NO PAST SURGERIES      OB History     Gravida  3   Para  2   Term  2   Preterm  0   AB  1   Living  2      SAB  1   IAB  0   Ectopic  0   Multiple  0   Live Births  2            Home Medications    Prior to Admission medications  Medication Sig Start Date End Date Taking? Authorizing Provider  azelastine  (ASTELIN ) 0.1 % nasal spray Place 1 spray into both nostrils 2 (two) times daily. Use in each nostril as directed 10/28/24  Yes Stuart Vernell Norris, PA-C   fluticasone [facility]) 50 MCG/ACT nasal spray 1 spray. 08/15/19  Yes [provider]  promethazine -dextromethorphan (PROMETHAZINE -DM) 6.25-15 MG/5ML syrup Take 5 mLs by mouth 4 (four) times daily as needed. 10/28/24  Yes Stuart Vernell Norris, PA-C  metroNIDAZOLE  (FLAGYL ) 500 MG tablet Take 1 tablet (500 mg total) by mouth 2 (two) times daily. 09/20/24   Signa Delon LABOR, NP    Family History Family History  Problem Relation Age of Onset   Depression Mother    Hearing loss Mother    Hearing loss Sister    Vision loss Sister    Hearing loss Maternal Grandmother    Heart disease Maternal Grandmother    Hyperlipidemia Maternal Grandmother    Hypertension Maternal Grandmother    Vision loss Maternal Grandmother    Arthritis Maternal Grandfather     Social History Social History[1]   Allergies   Patient has no known allergies.   Review of Systems Review of Systems PER HPI  Physical Exam Triage Vital Signs ED Triage Vitals [10/28/24 0901]  Encounter Vitals Group     BP 137/82     Girls Systolic BP Percentile      Girls Diastolic BP Percentile      Boys Systolic BP  Percentile      Boys Diastolic BP Percentile      Pulse Rate (!) 108     Resp 20     Temp 100.2 F (37.9 C)     Temp Source Oral     SpO2 96 %     Weight      Height      Head Circumference      Peak Flow      Pain Score 10     Pain Loc      Pain Education      Exclude from Growth Chart    No data found.  Updated Vital Signs BP 137/82 (BP Location: Right Arm)   Pulse (!) 108   Temp 100.2 F (37.9 C) (Oral)   Resp 20   LMP 10/12/2024   SpO2 96%   Visual Acuity Right Eye Distance:   Left Eye Distance:   Bilateral Distance:    Right Eye Near:   Left Eye Near:    Bilateral Near:     Physical Exam Vitals and nursing note reviewed.  Constitutional:      Appearance: Normal appearance.  HENT:     Head: Atraumatic.     Right Ear: Tympanic membrane and external ear normal.      Left Ear: Tympanic membrane and external ear normal.     Nose: Rhinorrhea present.     Mouth/Throat:     Mouth: Mucous membranes are moist.     Pharynx: Posterior oropharyngeal erythema present.  Eyes:     Extraocular Movements: Extraocular movements intact.     Conjunctiva/sclera: Conjunctivae normal.  Cardiovascular:     Rate and Rhythm: Normal rate and regular rhythm.     Heart sounds: Normal heart sounds.  Pulmonary:     Effort: Pulmonary effort is normal.     Breath sounds: Wheezing present. No rales.  Musculoskeletal:        General: Normal range of motion.     Cervical back: Normal range of motion and neck supple.  Skin:    General: Skin is warm and dry.  Neurological:     Mental Status: She is alert and oriented to person, place, and time.  Psychiatric:        Mood and Affect: Mood normal.        Thought Content: Thought content normal.      UC Treatments / Results  Labs (all labs ordered are listed, but only abnormal results are displayed) Labs Reviewed - No data to display  EKG   Radiology No results found.  Procedures Procedures (including critical care time)  Medications Ordered in UC Medications - No data to display  Initial Impression / Assessment and Plan / UC Course  I have reviewed the triage vital signs and the nursing notes.  Pertinent labs & imaging results that were available during my care of the patient were reviewed by me and considered in my medical decision making (see chart for details).     Febrile and tachycardic in triage, otherwise vital signs reassuring.  Vitals and exam consistent with viral respiratory infection causing a mild asthma exacerbation.  Viral testing declined in triage.  Will treat symptomatically with albuterol  inhaler every 4 hours as needed which she states she already has at home, Phenergan  DM, Astelin , supportive over-the-counter medications and home care.  Return for worsening or unresolving symptoms.  Final  Clinical Impressions(s) / UC Diagnoses   Final diagnoses:  Viral URI with cough  Fever, unspecified  Mild intermittent asthma with acute exacerbation   Discharge Instructions   None    ED Prescriptions     Medication Sig Dispense Auth. Provider   promethazine -dextromethorphan (PROMETHAZINE -DM) 6.25-15 MG/5ML syrup Take 5 mLs by mouth 4 (four) times daily as needed. 100 mL Stuart Vernell Norris, PA-C   azelastine  (ASTELIN ) 0.1 % nasal spray Place 1 spray into both nostrils 2 (two) times daily. Use in each nostril as directed 30 mL Stuart Vernell Norris, PA-C      PDMP not reviewed this encounter.    [1]  Social History Tobacco Use   Smoking status: Former    Current packs/day: 0.00    Types: Cigarettes    Quit date: 11/13/2015    Years since quitting: 8.9   Smokeless tobacco: Never  Vaping Use   Vaping status: Every Day  Substance Use Topics   Alcohol use: Yes    Comment: rarely   Drug use: Not Currently    Types: Marijuana    Comment: last used in feb 2019     Stuart Vernell Norris, [place] 10/28/24 [phone]

## 2024-10-28 NOTE — ED Triage Notes (Addendum)
 Pt reports headache, chills, sore throat, headache, bilateral ear popping, left ear pain and pressure for last several days. Has tried mucinex and tylenol . Reports even tylenol  has only helped minimally.  Declined viral testing in triage.

## 2024-12-03 ENCOUNTER — Ambulatory Visit: Payer: Self-pay | Admitting: Adult Health
